# Patient Record
Sex: Male | Born: 1950
Health system: Southern US, Community
[De-identification: ages and names within clinical notes are randomized; demographics above are authoritative.]

## PROBLEM LIST (undated history)

## (undated) DIAGNOSIS — Z8739 Personal history of other diseases of the musculoskeletal system and connective tissue: Secondary | ICD-10-CM

## (undated) DIAGNOSIS — F319 Bipolar disorder, unspecified: Secondary | ICD-10-CM

## (undated) DIAGNOSIS — K219 Gastro-esophageal reflux disease without esophagitis: Secondary | ICD-10-CM

## (undated) DIAGNOSIS — J189 Pneumonia, unspecified organism: Secondary | ICD-10-CM

## (undated) HISTORY — PX: OTHER SURGICAL HISTORY: SHX169

## (undated) HISTORY — PX: HERNIA REPAIR: SHX51

## (undated) HISTORY — PX: KNEE ARTHROSCOPY: SUR90

---

## 2003-02-24 ENCOUNTER — Ambulatory Visit (HOSPITAL_COMMUNITY): Admission: RE | Admit: 2003-02-24 | Discharge: 2003-02-24 | Payer: Self-pay | Admitting: Gastroenterology

## 2004-05-14 ENCOUNTER — Encounter: Admission: RE | Admit: 2004-05-14 | Discharge: 2004-05-14 | Payer: Self-pay | Admitting: Family Medicine

## 2008-04-22 ENCOUNTER — Encounter: Admission: RE | Admit: 2008-04-22 | Discharge: 2008-04-22 | Payer: Self-pay | Admitting: Family Medicine

## 2009-05-21 ENCOUNTER — Ambulatory Visit: Payer: Self-pay | Admitting: Family Medicine

## 2009-05-21 DIAGNOSIS — E785 Hyperlipidemia, unspecified: Secondary | ICD-10-CM

## 2009-05-21 DIAGNOSIS — J40 Bronchitis, not specified as acute or chronic: Secondary | ICD-10-CM | POA: Insufficient documentation

## 2009-05-21 HISTORY — DX: Bronchitis, not specified as acute or chronic: J40

## 2009-05-21 HISTORY — DX: Hyperlipidemia, unspecified: E78.5

## 2009-11-26 ENCOUNTER — Ambulatory Visit (HOSPITAL_COMMUNITY): Admission: RE | Admit: 2009-11-26 | Discharge: 2009-11-26 | Payer: Self-pay | Admitting: Gastroenterology

## 2010-04-22 NOTE — Assessment & Plan Note (Signed)
Summary: cough-thick, clear, hoarseness, x 8-9 dys rm 3   Vital Signs:  Patient Profile:   60 Years Old Male CC:      Cold & URI symptoms Height:     71 inches Weight:      223 pounds O2 Sat:      98 % O2 treatment:    Room Air Temp:     96.9 degrees F oral Pulse rate:   56 / minute Pulse rhythm:   regular Resp:     16 per minute BP sitting:   115 / 82  (right arm) Cuff size:   regular  Vitals Entered By: Areta Haber CMA (May 21, 2009 5:34 PM)                  Current Allergies: No known allergies History of Present Illness Chief Complaint: Cold & URI symptoms History of Present Illness: ONSET 8 DAYS AGO WITH COLD SYMPTOMS. NO FEVER. COUGH STARTED 3 DAYS AGO AND IS OCC PRODUCTIVE. NO SORE THROAT , SOME RUNNY NOSE. NO SEFL TX. HX OF PNEUMONIA IN THE PAST.   Current Problems: BRONCHITIS (ICD-490) HYPERLIPIDEMIA (ICD-272.4)   Current Meds LIPITOR 10 MG TABS (ATORVASTATIN CALCIUM) 1 tab by mouth once daily NEXIUM 40 MG CPDR (ESOMEPRAZOLE MAGNESIUM) 1 tab by mouth once daily ASPIR-LOW 81 MG TBEC (ASPIRIN) 1 tab by mouth once daily LEVAQUIN 500 MG TABS (LEVOFLOXACIN) 1 by mouth Q DAY CHERATUSSIN AC 100-10 MG/5ML SYRP (GUAIFENESIN-CODEINE) 1-2 TSP by mouth Q 6 HRS as needed COUGH  REVIEW OF SYSTEMS Constitutional Symptoms      Denies fever, chills, night sweats, weight loss, weight gain, and fatigue.  Eyes       Denies change in vision, eye pain, eye discharge, glasses, contact lenses, and eye surgery. Ear/Nose/Throat/Mouth       Complains of frequent runny nose and hoarseness.      Denies hearing loss/aids, change in hearing, ear pain, ear discharge, dizziness, frequent nose bleeds, sinus problems, sore throat, and tooth pain or bleeding.  Respiratory       Complains of productive cough, wheezing, and shortness of breath.      Denies asthma, bronchitis, and emphysema/COPD.  Cardiovascular       Denies murmurs, chest pain, and tires easily with exhertion.     Gastrointestinal       Denies stomach pain, nausea/vomiting, diarrhea, constipation, blood in bowel movements, and indigestion. Genitourniary       Denies painful urination, kidney stones, and loss of urinary control. Neurological       Denies paralysis, seizures, and fainting/blackouts. Musculoskeletal       Denies muscle pain, joint pain, joint stiffness, decreased range of motion, redness, swelling, muscle weakness, and gout.  Skin       Denies bruising, unusual mles/lumps or sores, and hair/skin or nail changes.  Psych       Denies mood changes, temper/anger issues, anxiety/stress, speech problems, depression, and sleep problems. Other Comments: x 8-9 dys. Pt has not seen PCP for this.   Past History:  Social History: Last updated: 05/21/2009 Married Never Smoked Alcohol use-yes - 10 weekly Drug use-no Regular exercise-no  Risk Factors: Exercise: no (05/21/2009)  Risk Factors: Smoking Status: never (05/21/2009)  Past Medical History: Hyperlipidemia Acid Reflux   Past Surgical History: Hernia Knee scope  Social History: Married Never Smoked Alcohol use-yes - 10 weekly Drug use-no Regular exercise-no Smoking Status:  never Drug Use:  no Does Patient Exercise:  no Physical Exam  General appearance: well developed, well nourished, no acute distress Nasal: CONGESTED Oral/Pharynx: tongue normal, posterior pharynx without erythema or exudate Chest/Lungs: no rales, wheezes, or rhonchi bilateral, breath sounds equal without effoT. CONGESTED COUGH Heart: regular rate and  rhythm, no murmur Skin: no obvious rashes or lesions Assessment New Problems: BRONCHITIS (ICD-490) HYPERLIPIDEMIA (ICD-272.4)   Plan New Medications/Changes: CHERATUSSIN AC 100-10 MG/5ML SYRP (GUAIFENESIN-CODEINE) 1-2 TSP by mouth Q 6 HRS as needed COUGH  #6 OZ x 0, 05/21/2009, Kimberly Lykins DO LEVAQUIN 500 MG TABS (LEVOFLOXACIN) 1 by mouth Q DAY  ##7 x 0, 05/21/2009, Marvis Moeller  DO  New Orders: New Patient Level III [99203]   Prescriptions: CHERATUSSIN AC 100-10 MG/5ML SYRP (GUAIFENESIN-CODEINE) 1-2 TSP by mouth Q 6 HRS as needed COUGH  #6 OZ x 0   Entered and Authorized by:   Marvis Moeller DO   Signed by:   Marvis Moeller DO on 05/21/2009   Method used:   Print then Give to Patient   RxID:   [id number] LEVAQUIN 500 MG TABS (LEVOFLOXACIN) 1 by mouth Q DAY  ##7 x 0   Entered and Authorized by:   Marvis Moeller DO   Signed by:   Marvis Moeller DO on 05/21/2009   Method used:   Print then Give to Patient   RxID:   [id number]   Patient Instructions: 1)  TYLENOL OR MOTRIN AS NEEDED. MUCINEX RECOMMENDED. AVOID CAFFEINE AND MILK PRODUTS. FOLLOW UP WITH YOUR PCP IF SYMPTOMS PERSIST OR WORSEN.

## 2010-08-06 NOTE — Op Note (Signed)
NAME:  Cameron Stout, Cameron Stout                          ACCOUNT NO.:  [id number]   MEDICAL RECORD NO.:  [id number]                   PATIENT TYPE:  AMB   LOCATION:  ENDO                                 FACILITY:  [facility]   PHYSICIAN:  Danise Edge, M.D.                DATE OF BIRTH:  December 18, 1950   DATE OF PROCEDURE:  02/24/2003  DATE OF DISCHARGE:                                 OPERATIVE REPORT   PROCEDURE:  Diagnostic colonoscopy.   PROCEDURE INDICATION:  Mr. [name] is a 60 year old male born [birth year].  Mr. [name] had five days of painless hematochezia before  spontaneously resolving.  He is scheduled to undergo diagnostic colonoscopy.   ENDOSCOPIST:  Danise Edge, M.D.   PREMEDICATION:  Versed 7.5 mg, Demerol 100 mg.   DESCRIPTION OF PROCEDURE:  After obtaining informed consent, Mr. [name] was  placed in the left lateral decubitus position.  I administered intravenous  Demerol and intravenous Versed to achieve conscious sedation for the  procedure.  The patient's blood pressure, oxygen saturation, and cardiac  rhythm were monitored throughout the procedure and documented in the medical  record.   Anal inspection was normal.  Digital rectal exam revealed a small, non-  nodular prostate.  The Olympus adjustable pediatric colonoscope was  introduced into the rectum and easily advanced to the cecum.  Colonic  preparation for the exam today was excellent.   Rectum normal.   Sigmoid colon and descending colon:  Scattered left colonic diverticulosis  without diverticulitis, diverticular stricture formation, or diverticular  bleeding.   Splenic flexure normal.   Transverse colon normal.   Hepatic flexure normal.   Ascending colon normal.   Cecum and ileocecal valve normal.   ASSESSMENT:  Left colonic diverticulosis; otherwise normal proctocolonoscopy  to the cecum.  No endoscopic evidence for the presence of colorectal  neoplasia, lower gastrointestinal bleeding, or  inflammatory bowel disease.                                               Danise Edge, M.D.    MJ/MEDQ  D:  02/24/2003  T:  02/24/2003  Job:  [id number]   cc:   Donia Guiles, M.D.  [address] [place]  [place] [address]  Fax: [phone]

## 2011-06-01 DIAGNOSIS — M7540 Impingement syndrome of unspecified shoulder: Secondary | ICD-10-CM | POA: Insufficient documentation

## 2011-06-01 HISTORY — DX: Impingement syndrome of unspecified shoulder: M75.40

## 2012-07-19 DIAGNOSIS — M51379 Other intervertebral disc degeneration, lumbosacral region without mention of lumbar back pain or lower extremity pain: Secondary | ICD-10-CM | POA: Insufficient documentation

## 2012-07-19 HISTORY — DX: Other intervertebral disc degeneration, lumbosacral region without mention of lumbar back pain or lower extremity pain: M51.379

## 2012-08-25 DIAGNOSIS — J4520 Mild intermittent asthma, uncomplicated: Secondary | ICD-10-CM | POA: Insufficient documentation

## 2012-08-25 HISTORY — DX: Mild intermittent asthma, uncomplicated: J45.20

## 2014-03-18 DIAGNOSIS — M353 Polymyalgia rheumatica: Secondary | ICD-10-CM | POA: Insufficient documentation

## 2014-03-18 HISTORY — DX: Polymyalgia rheumatica: M35.3

## 2014-04-09 ENCOUNTER — Other Ambulatory Visit: Payer: Self-pay | Admitting: Gastroenterology

## 2014-06-12 ENCOUNTER — Encounter (HOSPITAL_COMMUNITY): Payer: Self-pay | Admitting: *Deleted

## 2014-06-22 NOTE — Anesthesia Preprocedure Evaluation (Signed)
Anesthesia Evaluation  Patient identified by MRN, date of birth, ID band Patient awake    Reviewed: Allergy & Precautions, NPO status , Patient's Chart, lab work & pertinent test results  History of Anesthesia Complications Negative for: history of anesthetic complications  Airway Mallampati: II  TM Distance: >3 FB Neck ROM: Full    Dental no notable dental hx. (+) Dental Advisory Given   Pulmonary neg pulmonary ROS,  breath sounds clear to auscultation  Pulmonary exam normal       Cardiovascular negative cardio ROS  Rhythm:Regular Rate:Normal     Neuro/Psych PSYCHIATRIC DISORDERS Bipolar Disorder negative neurological ROS     GI/Hepatic Neg liver ROS, GERD-  Medicated and Controlled,  Endo/Other  obesity  Renal/GU negative Renal ROS  negative genitourinary   Musculoskeletal negative musculoskeletal ROS (+)   Abdominal   Peds negative pediatric ROS (+)  Hematology negative hematology ROS (+)   Anesthesia Other Findings   Reproductive/Obstetrics negative OB ROS                             Anesthesia Physical Anesthesia Plan  ASA: II  Anesthesia Plan: MAC   Post-op Pain Management:    Induction: Intravenous  Airway Management Planned: Nasal Cannula  Additional Equipment:   Intra-op Plan:   Post-operative Plan:   Informed Consent: I have reviewed the patients History and Physical, chart, labs and discussed the procedure including the risks, benefits and alternatives for the proposed anesthesia with the patient or authorized representative who has indicated his/her understanding and acceptance.   Dental advisory given  Plan Discussed with: CRNA  Anesthesia Plan Comments:         Anesthesia Quick Evaluation

## 2014-06-23 ENCOUNTER — Encounter (HOSPITAL_COMMUNITY)
Admission: RE | Disposition: A | Discharge status: Home / Self Care | Payer: Self-pay | Source: Ambulatory Visit | Attending: Gastroenterology

## 2014-06-23 ENCOUNTER — Encounter (HOSPITAL_COMMUNITY): Payer: Self-pay | Admitting: *Deleted

## 2014-06-23 ENCOUNTER — Ambulatory Visit (HOSPITAL_COMMUNITY): Payer: BLUE CROSS/BLUE SHIELD | Admitting: Certified Registered Nurse Anesthetist

## 2014-06-23 ENCOUNTER — Ambulatory Visit (HOSPITAL_COMMUNITY)
Admission: RE | Admit: 2014-06-23 | Discharge: 2014-06-23 | Disposition: A | Discharge status: Home / Self Care | Payer: BLUE CROSS/BLUE SHIELD | Source: Ambulatory Visit | Attending: Gastroenterology | Admitting: Gastroenterology

## 2014-06-23 DIAGNOSIS — K219 Gastro-esophageal reflux disease without esophagitis: Secondary | ICD-10-CM | POA: Diagnosis not present

## 2014-06-23 DIAGNOSIS — K5190 Ulcerative colitis, unspecified, without complications: Secondary | ICD-10-CM | POA: Diagnosis not present

## 2014-06-23 DIAGNOSIS — Z1211 Encounter for screening for malignant neoplasm of colon: Secondary | ICD-10-CM | POA: Insufficient documentation

## 2014-06-23 DIAGNOSIS — M353 Polymyalgia rheumatica: Secondary | ICD-10-CM | POA: Diagnosis not present

## 2014-06-23 DIAGNOSIS — E780 Pure hypercholesterolemia: Secondary | ICD-10-CM | POA: Diagnosis not present

## 2014-06-23 DIAGNOSIS — K5730 Diverticulosis of large intestine without perforation or abscess without bleeding: Secondary | ICD-10-CM | POA: Diagnosis not present

## 2014-06-23 DIAGNOSIS — F319 Bipolar disorder, unspecified: Secondary | ICD-10-CM | POA: Insufficient documentation

## 2014-06-23 HISTORY — DX: Pneumonia, unspecified organism: J18.9

## 2014-06-23 HISTORY — DX: Gastro-esophageal reflux disease without esophagitis: K21.9

## 2014-06-23 HISTORY — DX: Personal history of other diseases of the musculoskeletal system and connective tissue: Z87.39

## 2014-06-23 HISTORY — DX: Bipolar disorder, unspecified: F31.9

## 2014-06-23 HISTORY — PX: COLONOSCOPY WITH PROPOFOL: SHX5780

## 2014-06-23 SURGERY — COLONOSCOPY WITH PROPOFOL
Anesthesia: Monitor Anesthesia Care

## 2014-06-23 MED ORDER — LIDOCAINE HCL (CARDIAC) 20 MG/ML IV SOLN
INTRAVENOUS | Status: DC | PRN
  Administered 2014-06-23: 80 mg via INTRAVENOUS

## 2014-06-23 MED ORDER — EPHEDRINE SULFATE 50 MG/ML IJ SOLN
INTRAMUSCULAR | Status: DC | PRN
  Administered 2014-06-23: 5 mg via INTRAVENOUS

## 2014-06-23 MED ORDER — PROPOFOL 10 MG/ML IV BOLUS
INTRAVENOUS | Status: AC
  Filled 2014-06-23: qty 40

## 2014-06-23 MED ORDER — PROPOFOL 10 MG/ML IV BOLUS
INTRAVENOUS | Status: DC | PRN
  Administered 2014-06-23 (×2): 20 mg via INTRAVENOUS

## 2014-06-23 MED ORDER — SODIUM CHLORIDE 0.9 % IV SOLN: INTRAVENOUS | Status: DC

## 2014-06-23 MED ORDER — ONDANSETRON HCL 4 MG/2ML IJ SOLN
INTRAMUSCULAR | Status: DC | PRN
  Administered 2014-06-23: 4 mg via INTRAVENOUS

## 2014-06-23 MED ORDER — LACTATED RINGERS IV SOLN
INTRAVENOUS | Status: DC
  Administered 2014-06-23: 1000 mL via INTRAVENOUS

## 2014-06-23 MED ORDER — ONDANSETRON HCL 4 MG/2ML IJ SOLN
INTRAMUSCULAR | Status: AC
  Filled 2014-06-23: qty 2

## 2014-06-23 MED ORDER — LIDOCAINE HCL (CARDIAC) 20 MG/ML IV SOLN
INTRAVENOUS | Status: AC
  Filled 2014-06-23: qty 5

## 2014-06-23 MED ORDER — PROPOFOL INFUSION 10 MG/ML OPTIME
INTRAVENOUS | Status: DC | PRN
  Administered 2014-06-23: 250 ug/kg/min via INTRAVENOUS

## 2014-06-23 SURGICAL SUPPLY — 22 items

## 2014-06-23 NOTE — Transfer of Care (Signed)
Immediate Anesthesia Transfer of Care Note  Patient: Cameron Stout  Procedure(s) Performed: Procedure(s) (LRB): COLONOSCOPY WITH PROPOFOL (N/A)  Patient Location: PACU  Anesthesia Type: MAC  Level of Consciousness: sedated, patient cooperative and responds to stimulation  Airway & Oxygen Therapy: Patient Spontanous Breathing and Patient connected to face mask oxgen  Post-op Assessment: Report given to PACU RN and Post -op Vital signs reviewed and stable  Post vital signs: Reviewed and stable  Complications: No apparent anesthesia complications

## 2014-06-23 NOTE — Discharge Instructions (Signed)

## 2014-06-23 NOTE — H&P (Signed)
  Procedure: Screening colonoscopy. 11/26/2009 normal esophagogastroduodenoscopy. 02/24/2003 normal screening colonoscopy  History: The patient is a 64 year old male born 06/21/1950. He is scheduled to undergo a repeat screening colonoscopy today. This 62 year old son was recently diagnosed with ulcerative colitis.  Medication allergies: Lipitor causes muscle aches  Past medical history: Septoplasty. Inguinal hernia repair. Hypercholesterolemia. Gastroesophageal reflux with normal esophagogastroduodenoscopy in September 2011. Bipolar disorder. Polymyalgia rheumatica  Exam: The patient is alert and lying comfortably on the endoscopy stretcher. Abdomen is soft and nontender to palpation. Lungs are clear to auscultation. Cardiac exam reveals a regular rhythm.  Plan: Proceed with screening colonoscopy

## 2014-06-23 NOTE — Anesthesia Postprocedure Evaluation (Signed)
  Anesthesia Post-op Note  Patient: Cameron Stout  Procedure(s) Performed: Procedure(s) (LRB): COLONOSCOPY WITH PROPOFOL (N/A)  Patient Location: PACU  Anesthesia Type: MAC  Level of Consciousness: awake and alert   Airway and Oxygen Therapy: Patient Spontanous Breathing  Post-op Pain: mild  Post-op Assessment: Post-op Vital signs reviewed, Patient's Cardiovascular Status Stable, Respiratory Function Stable, Patent Airway and No signs of Nausea or vomiting  Last Vitals:  Filed Vitals:   06/23/14 1124  BP: 122/68  Pulse: 63  Temp:   Resp: 14    Post-op Vital Signs: stable   Complications: No apparent anesthesia complications

## 2014-06-23 NOTE — Op Note (Signed)
Procedure: Screening colonoscopy. 11/26/2009 normal esophagogastroduodenoscopy. 02/24/2003 normal screening colonoscopy. 756 year old son recently diagnosed with ulcerative colitis  Endoscopist: Danise EdgeMartin Johnson  Premedication: Propofol administered by anesthesia  Procedure: The patient was placed in the left lateral decubitus position. Anal inspection and digital rectal exam were normal. The Pentax pediatric colonoscope was introduced into the rectum and advanced to the cecum. A normal-appearing appendiceal orifice was identified. A normal-appearing ileocecal valve was intubated and the terminal ileum inspected. Colonic preparation for the exam today was good. Withdrawal time was 9 minutes  Rectum. Normal. Retroflexed view of the distal rectum normal  Sigmoid colon and descending colon. Left colonic diverticulosis  Splenic flexure. Normal  Transverse colon. Normal  Hepatic flexure. Normal  Ascending colon. Normal  Cecum and ileocecal valve. Normal  Terminal ileum. Normal  Assessment: Normal screening colonoscopy.  Recommendation: Schedule repeat screening colonoscopy in 10 years

## 2014-06-24 ENCOUNTER — Encounter (HOSPITAL_COMMUNITY): Payer: Self-pay | Admitting: Gastroenterology

## 2015-05-21 DIAGNOSIS — M8580 Other specified disorders of bone density and structure, unspecified site: Secondary | ICD-10-CM | POA: Insufficient documentation

## 2015-05-21 HISTORY — DX: Other specified disorders of bone density and structure, unspecified site: M85.80

## 2015-12-23 DIAGNOSIS — Z7952 Long term (current) use of systemic steroids: Secondary | ICD-10-CM | POA: Insufficient documentation

## 2015-12-23 HISTORY — DX: Long term (current) use of systemic steroids: Z79.52

## 2016-03-12 DIAGNOSIS — G894 Chronic pain syndrome: Secondary | ICD-10-CM

## 2016-03-12 DIAGNOSIS — E782 Mixed hyperlipidemia: Secondary | ICD-10-CM

## 2016-03-12 DIAGNOSIS — F19982 Other psychoactive substance use, unspecified with psychoactive substance-induced sleep disorder: Secondary | ICD-10-CM | POA: Insufficient documentation

## 2016-03-12 DIAGNOSIS — J302 Other seasonal allergic rhinitis: Secondary | ICD-10-CM

## 2016-03-12 HISTORY — DX: Other psychoactive substance use, unspecified with psychoactive substance-induced sleep disorder: F19.982

## 2016-03-12 HISTORY — DX: Chronic pain syndrome: G89.4

## 2016-03-12 HISTORY — DX: Other seasonal allergic rhinitis: J30.2

## 2016-03-12 HISTORY — DX: Mixed hyperlipidemia: E78.2

## 2016-05-18 DIAGNOSIS — M5412 Radiculopathy, cervical region: Secondary | ICD-10-CM | POA: Insufficient documentation

## 2016-05-18 HISTORY — DX: Radiculopathy, cervical region: M54.12

## 2017-11-27 ENCOUNTER — Other Ambulatory Visit: Payer: Self-pay | Admitting: Family Medicine

## 2017-11-27 ENCOUNTER — Ambulatory Visit
Admission: RE | Admit: 2017-11-27 | Discharge: 2017-11-27 | Disposition: A | Discharge status: Home / Self Care | Payer: Self-pay | Source: Ambulatory Visit | Attending: Family Medicine | Admitting: Family Medicine

## 2017-11-27 DIAGNOSIS — R0689 Other abnormalities of breathing: Secondary | ICD-10-CM

## 2017-11-27 DIAGNOSIS — R0989 Other specified symptoms and signs involving the circulatory and respiratory systems: Secondary | ICD-10-CM

## 2017-12-21 ENCOUNTER — Other Ambulatory Visit: Payer: Self-pay

## 2017-12-21 MED ORDER — OXCARBAZEPINE 150 MG PO TABS: 150.0000 mg | ORAL_TABLET | Freq: Two times a day (BID) | ORAL | 0 refills | Status: DC

## 2017-12-21 NOTE — Progress Notes (Signed)
Sent a one month refill of oxcarbazepine 150mg 

## 2018-01-15 ENCOUNTER — Other Ambulatory Visit: Payer: Self-pay

## 2018-01-15 ENCOUNTER — Other Ambulatory Visit: Payer: Self-pay | Admitting: Psychiatry

## 2018-02-02 DIAGNOSIS — M5416 Radiculopathy, lumbar region: Secondary | ICD-10-CM | POA: Insufficient documentation

## 2018-02-02 DIAGNOSIS — M47816 Spondylosis without myelopathy or radiculopathy, lumbar region: Secondary | ICD-10-CM | POA: Insufficient documentation

## 2018-02-02 HISTORY — DX: Radiculopathy, lumbar region: M54.16

## 2018-02-02 HISTORY — DX: Spondylosis without myelopathy or radiculopathy, lumbar region: M47.816

## 2018-02-19 ENCOUNTER — Other Ambulatory Visit: Payer: Self-pay | Admitting: Psychiatry

## 2018-02-19 NOTE — Telephone Encounter (Signed)
Need to review paper chart  

## 2018-11-23 ENCOUNTER — Ambulatory Visit: Payer: BC Managed Care – PPO | Admitting: Psychiatry

## 2018-11-23 ENCOUNTER — Other Ambulatory Visit: Payer: Self-pay

## 2018-11-23 ENCOUNTER — Encounter: Payer: Self-pay | Admitting: Psychiatry

## 2018-11-23 DIAGNOSIS — F5105 Insomnia due to other mental disorder: Secondary | ICD-10-CM | POA: Diagnosis not present

## 2018-11-23 DIAGNOSIS — F39 Unspecified mood [affective] disorder: Secondary | ICD-10-CM

## 2018-11-23 NOTE — Progress Notes (Signed)
Cameron Stout [id number] 02/25/1951 68 y.o.  Subjective:   Patient ID:  Cameron Stout is a 68 y.o. (DOB 11/24/1950) male.  Chief Complaint:  Chief Complaint  Patient presents with  . Follow-up    Medication Management    HPI Cameron Stout presents to the office today for follow-up of episodic mood disorder.  Last seen November 17, 2017.  No meds were changed.  He remained on Trileptal 150 mg tablet one half every morning and 2 tablets every afternoon, Lunesta 2 mg nightly as needed insomnia  Disc retirement and need for something to do when he cuts back work.   He plans to gradually cut back and not stop.   Overall OK.  During high stress March-May increased Trileptal 2 tab BID and then able to cut it back.   Able to do 5-6 mile hikes.   Motorcycle run again this year for 3100 miles.   Patient reports stable mood and denies depressed but mild manageable irritable moods.  Patient denies any recent difficulty with anxiety.  Patient denies difficulty with sleep initiation or maintenance. Rare Lunesta.  Denies appetite disturbance.  Patient reports that energy and motivation have been good.  Patient denies any difficulty with concentration.  Patient denies any suicidal ideation.  Past Psychiatric Medication Trials: Lithium 750, Depakote, Equetro, Trileptal since 2015, Lunesta, propranolol, fluoxetine, sertraline, ProSom Under care of this practice since 2007  Review of Systems:  Review of Systems  Constitutional: Negative for fatigue.  Neurological: Negative for tremors and weakness.    Medications: I have reviewed the patient's current medications.  Current Outpatient Medications  Medication Sig Dispense Refill  . aspirin EC 81 MG tablet Take 81 mg by mouth daily.    Marland Kitchen. dexlansoprazole (DEXILANT) 60 MG capsule Take 60 mg by mouth daily.    Marland Kitchen. ezetimibe (ZETIA) 10 MG tablet Take 10 mg by mouth every morning.    . loratadine (CLARITIN) 10 MG tablet Take 10 mg by mouth daily.    .  OXcarbazepine (TRILEPTAL) 150 MG tablet Take 1 tablet (150 mg total) by mouth 2 (two) times daily. Take 1 tablet in the morning and 3 at bedtime. (Patient taking differently: Take 150 mg by mouth 2 (two) times daily. Take 1 tablet in the morning and 2 at bedtime.) 120 tablet 6   No current facility-administered medications for this visit.     Medication Side Effects: None  Allergies: No Known Allergies  Past Medical History:  Diagnosis Date  . Bipolar disorder (HCC)   . GERD (gastroesophageal reflux disease)   . H/O polymyalgia rheumatica   . Pneumonia     History reviewed. No pertinent family history.  Social History   Socioeconomic History  . Marital status: Married    Spouse name: Not on file  . Number of children: Not on file  . Years of education: Not on file  . Highest education level: Not on file  Occupational History  . Not on file  Social Needs  . Financial resource strain: Not on file  . Food insecurity    Worry: Not on file    Inability: Not on file  . Transportation needs    Medical: Not on file    Non-medical: Not on file  Tobacco Use  . Smoking status: Never Smoker  . Smokeless tobacco: Never Used  Substance and Sexual Activity  . Alcohol use: Yes    Comment: occassionally  . Drug use: No  . Sexual activity: Not on  file  Lifestyle  . Physical activity    Days per week: Not on file    Minutes per session: Not on file  . Stress: Not on file  Relationships  . Social Herbalist on phone: Not on file    Gets together: Not on file    Attends religious service: Not on file    Active member of club or organization: Not on file    Attends meetings of clubs or organizations: Not on file    Relationship status: Not on file  . Intimate partner violence    Fear of current or ex partner: Not on file    Emotionally abused: Not on file    Physically abused: Not on file    Forced sexual activity: Not on file  Other Topics Concern  . Not on file   Social History Narrative  . Not on file    Past Medical History, Surgical history, Social history, and Family history were reviewed and updated as appropriate.   Please see review of systems for further details on the patient's review from today.   Objective:   Physical Exam:  There were no vitals taken for this visit.  Physical Exam Constitutional:      General: He is not in acute distress.    Appearance: He is well-developed.  Musculoskeletal:        General: No deformity.  Neurological:     Mental Status: He is alert and oriented to person, place, and time.     Coordination: Coordination normal.  Psychiatric:        Attention and Perception: Attention and perception normal. He does not perceive auditory or visual hallucinations.        Mood and Affect: Mood is not anxious or depressed. Affect is not labile, blunt, angry or inappropriate.        Speech: Speech normal.        Behavior: Behavior normal.        Thought Content: Thought content normal. Thought content is not paranoid or delusional. Thought content does not include homicidal or suicidal ideation. Thought content does not include homicidal or suicidal plan.        Cognition and Memory: Cognition and memory normal.        Judgment: Judgment normal.     Comments: Insight intact     Lab Review:  No results found for: NA, K, CL, CO2, GLUCOSE, BUN, CREATININE, CALCIUM, PROT, ALBUMIN, AST, ALT, ALKPHOS, BILITOT, GFRNONAA, GFRAA  No results found for: WBC, RBC, HGB, HCT, PLT, MCV, MCH, MCHC, RDW, LYMPHSABS, MONOABS, EOSABS, BASOSABS  No results found for: POCLITH, LITHIUM   No results found for: PHENYTOIN, PHENOBARB, VALPROATE, CBMZ   .res Assessment: Plan:    Demetrius was seen today for follow-up.  Diagnoses and all orders for this visit:  Episodic mood disorder ([place])  Insomnia due to mental condition   Dx questions chronically between episodic mood disorder, bipolar type II, or atypical depression.  As  noted he is tried several different medications and has responded best to Trileptal.  His primary symptom has been irritability and a history of anger outbursts that affected him at both work and home.  These have generally been managed well with the Trileptal.  He has been on Trileptal since 2015.  He is tolerating it well.  He had a bout with probably myalgia rheumatica which lasted for 3 years.  He is resolved he has been able to get off prednisone.  An autoimmune diet by Dr. Phillis Haggis has been very helpful.  He is exercising and that helps both his mood and his physical conditioning.  Generally sleeping well without insists Lunesta but as needed on occasion.  No med changes indicated  Because of stability follow-up 1 year  Meredith Staggers, MD, DFAPA  Please see After Visit Summary for patient specific instructions.  No future appointments.  No orders of the defined types were placed in this encounter.   -------------------------------

## 2019-03-21 ENCOUNTER — Other Ambulatory Visit: Payer: Self-pay | Admitting: Psychiatry

## 2019-03-21 NOTE — Telephone Encounter (Signed)
Thinks this is an old rx for him

## 2019-03-24 NOTE — Telephone Encounter (Signed)
I'm not sure the correct directions. Looks like discrepancy ?

## 2019-04-13 ENCOUNTER — Ambulatory Visit: Payer: Self-pay

## 2019-05-24 ENCOUNTER — Encounter: Payer: Self-pay | Admitting: Psychiatry

## 2019-05-24 ENCOUNTER — Ambulatory Visit (INDEPENDENT_AMBULATORY_CARE_PROVIDER_SITE_OTHER): Payer: BC Managed Care – PPO | Admitting: Psychiatry

## 2019-05-24 ENCOUNTER — Other Ambulatory Visit: Payer: Self-pay

## 2019-05-24 DIAGNOSIS — F39 Unspecified mood [affective] disorder: Secondary | ICD-10-CM

## 2019-05-24 DIAGNOSIS — F5105 Insomnia due to other mental disorder: Secondary | ICD-10-CM | POA: Diagnosis not present

## 2019-05-24 NOTE — Progress Notes (Signed)
[name] [id number] 1950-09-17 69 y.o.  Subjective:   Patient ID:  Cameron Stout is a 69 y.o. (DOB 10-17-1950) male.  Chief Complaint:  Chief Complaint  Patient presents with  . Follow-up    Medication Management  . Other    Episodic mood disorder  . Sleeping Problem  . Stress    HPI Cameron Stout presents to the office today for follow-up of episodic mood disorder.  Last seen Sept 2020.  No meds were changed.  He remained on Trileptal 150 mg tablet one half every morning and 2 tablets every afternoon, Lunesta 2 mg nightly as needed insomnia  Forcing to partner or join big group and it's stresful and trying to decide what to do.  Affects his sleep.  Parnter not handling it well.  Lunesta helps.    Disc retirement and need for something to do when he cuts back work.   He plans to gradually cut back and not stop.   Overall OK.  During high stress March-May increased Trileptal 2 tab BID and then able to cut it back.   Able to do 5-6 mile hikes.   Motorcycle run again this year for 3100 miles.   Patient reports stable mood and denies depressed but mild manageable irritable moods.  Patient denies any recent difficulty with anxiety.  Patient denies difficulty with sleep initiation or maintenance. Rare Lunesta. Average 6 hours sleep is good.   Denies appetite disturbance.  Patient reports that energy and motivation have been good.  Patient denies any difficulty with concentration.  Patient denies any suicidal ideation.  Past Psychiatric Medication Trials: Lithium 750, Depakote, Equetro, Trileptal since 2015, Lunesta, propranolol, fluoxetine, sertraline, ProSom Under care of this practice since 2007  Review of Systems:  Review of Systems  Constitutional: Negative for fatigue.  Neurological: Negative for tremors and weakness.    Medications: I have reviewed the patient's current medications.  Current Outpatient Medications  Medication Sig Dispense Refill  .  aspirin EC 81 MG tablet Take 81 mg by mouth daily.    Marland Kitchen dexlansoprazole (DEXILANT) 60 MG capsule Take 60 mg by mouth daily.    . Eszopiclone 3 MG TABS TAKE 1 TABLET BY MOUTH AT BEDTIME 30 tablet 4  . ezetimibe (ZETIA) 10 MG tablet Take 10 mg by mouth every morning.    . loratadine (CLARITIN) 10 MG tablet Take 10 mg by mouth daily.    . OXcarbazepine (TRILEPTAL) 150 MG tablet TAKE 1 TABLET BY MOUTH TWICE A DAY 1 TAB IN AM & 3TABS AT BEDTIME 120 tablet 6   No current facility-administered medications for this visit.    Medication Side Effects: None  Allergies: No Known Allergies  Past Medical History:  Diagnosis Date  . Bipolar disorder ([place])   . GERD (gastroesophageal reflux disease)   . H/O polymyalgia rheumatica   . Pneumonia     History reviewed. No pertinent family history.  Social History   Socioeconomic History  . Marital status: Married    Spouse name: Not on file  . Number of children: Not on file  . Years of education: Not on file  . Highest education level: Not on file  Occupational History  . Not on file  Tobacco Use  . Smoking status: Never Smoker  . Smokeless tobacco: Never Used  Substance and Sexual Activity  . Alcohol use: Yes    Comment: occassionally  . Drug use: No  . Sexual activity: Not on file  Other Topics Concern  .  Not on file  Social History Narrative  . Not on file   Social Determinants of Health   Financial Resource Strain:   . Difficulty of Paying Living Expenses: Not on file  Food Insecurity:   . Worried About Programme researcher, broadcasting/film/video in the Last Year: Not on file  . Ran Out of Food in the Last Year: Not on file  Transportation Needs:   . Lack of Transportation (Medical): Not on file  . Lack of Transportation (Non-Medical): Not on file  Physical Activity:   . Days of Exercise per Week: Not on file  . Minutes of Exercise per Session: Not on file  Stress:   . Feeling of Stress : Not on file  Social Connections:   . Frequency of  Communication with Friends and Family: Not on file  . Frequency of Social Gatherings with Friends and Family: Not on file  . Attends Religious Services: Not on file  . Active Member of Clubs or Organizations: Not on file  . Attends Banker Meetings: Not on file  . Marital Status: Not on file  Intimate Partner Violence:   . Fear of Current or Ex-Partner: Not on file  . Emotionally Abused: Not on file  . Physically Abused: Not on file  . Sexually Abused: Not on file    Past Medical History, Surgical history, Social history, and Family history were reviewed and updated as appropriate.   Please see review of systems for further details on the patient's review from today.   Objective:   Physical Exam:  There were no vitals taken for this visit.  Physical Exam Constitutional:      General: He is not in acute distress.    Appearance: He is well-developed.  Musculoskeletal:        General: No deformity.  Neurological:     Mental Status: He is alert and oriented to person, place, and time.     Coordination: Coordination normal.  Psychiatric:        Attention and Perception: Attention and perception normal. He does not perceive auditory or visual hallucinations.        Mood and Affect: Mood is not anxious or depressed. Affect is not labile, blunt, angry or inappropriate.        Speech: Speech normal.        Behavior: Behavior normal.        Thought Content: Thought content normal. Thought content is not paranoid or delusional. Thought content does not include homicidal or suicidal ideation. Thought content does not include homicidal or suicidal plan.        Cognition and Memory: Cognition and memory normal.        Judgment: Judgment normal.     Comments: Insight intact     Lab Review:  No results found for: NA, K, CL, CO2, GLUCOSE, BUN, CREATININE, CALCIUM, PROT, ALBUMIN, AST, ALT, ALKPHOS, BILITOT, GFRNONAA, GFRAA  No results found for: WBC, RBC, HGB, HCT, PLT, MCV,  MCH, MCHC, RDW, LYMPHSABS, MONOABS, EOSABS, BASOSABS  No results found for: POCLITH, LITHIUM   No results found for: PHENYTOIN, PHENOBARB, VALPROATE, CBMZ   .res Assessment: Plan:    [name] was seen today for follow-up, other, sleeping problem and stress.  Diagnoses and all orders for this visit:  Episodic mood disorder (HCC)  Insomnia due to mental condition   Dx questions chronically between episodic mood disorder, bipolar type II, or atypical depression.  As noted he is tried several different medications and has  responded best to Trileptal.  His primary symptom has been irritability and a history of anger outbursts that affected him at both work and home.  These have generally been managed well with the Trileptal.  He has been on Trileptal since 2015.  He is tolerating it well.  He had a bout with probably myalgia rheumatica which lasted for 3 years.  He is resolved he has been able to get off prednisone.  An autoimmune diet by Dr. Phillis Haggis has been very helpful.  He is exercising and that helps both his mood and his physical conditioning.  Generally sleeping well without insists Lunesta but as needed on occasion.  Disc dependence and tolerance and how to do it.    Discussed his gradual slow transition into retirement and the need to develop hobbies for mental health reasons.  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  No med changes indicated  Because of stability follow-up 1 year  Meredith Staggers, MD, DFAPA  Please see After Visit Summary for patient specific instructions.  No future appointments.  No orders of the defined types were placed in this encounter.   -------------------------------

## 2019-10-16 ENCOUNTER — Other Ambulatory Visit: Payer: Self-pay | Admitting: Psychiatry

## 2019-10-17 NOTE — Telephone Encounter (Signed)
2 different directions? Bid and 1 in am and 3 in pm?

## 2019-11-29 ENCOUNTER — Ambulatory Visit: Payer: BC Managed Care – PPO | Admitting: Psychiatry

## 2019-12-13 ENCOUNTER — Encounter: Payer: Self-pay | Admitting: Psychiatry

## 2019-12-13 ENCOUNTER — Ambulatory Visit (INDEPENDENT_AMBULATORY_CARE_PROVIDER_SITE_OTHER): Payer: BC Managed Care – PPO | Admitting: Psychiatry

## 2019-12-13 ENCOUNTER — Other Ambulatory Visit: Payer: Self-pay

## 2019-12-13 DIAGNOSIS — F39 Unspecified mood [affective] disorder: Secondary | ICD-10-CM | POA: Diagnosis not present

## 2019-12-13 DIAGNOSIS — F5105 Insomnia due to other mental disorder: Secondary | ICD-10-CM | POA: Diagnosis not present

## 2019-12-13 MED ORDER — ESZOPICLONE 3 MG PO TABS: 3.0000 mg | ORAL_TABLET | Freq: Every day | ORAL | 4 refills | Status: DC

## 2019-12-13 MED ORDER — OXCARBAZEPINE 150 MG PO TABS: ORAL_TABLET | ORAL | 2 refills | Status: DC

## 2019-12-13 NOTE — Progress Notes (Signed)
Haniel Fix [id number] 09-Sep-1950 69 y.o.  Subjective:   Patient ID:  Cameron Stout is a 69 y.o. (DOB 29-Jun-1950) male.  Chief Complaint:  Chief Complaint  Patient presents with  . Follow-up    mood and meds    HPI Edenilson Austad Timothy presents to the office today for follow-up of episodic mood disorder.  seen Sept 2020.  No meds were changed.  He remained on Trileptal 150 mg tablet one half every morning and 2 tablets every afternoon, Lunesta 2 mg nightly as needed insomnia  05/24/19 appt with the following noted: Forcing to partner or join big group and it's stresful and trying to decide what to do.  Affects his sleep.  Parnter not handling it well.  Lunesta helps.   Disc retirement and need for something to do when he cuts back work.   He plans to gradually cut back and not stop. Overall OK.  During high stress March-May increased Trileptal 2 tab BID and then able to cut it back.  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  Big decision.  Plans to only work 3 days/week.  Plans to continue that for 6 mos and reassess. Busy with side businesses rental houses.   Mood has been stable.  Stress can affect sleep and some irritability at times. Still rides motorcycle for fun.  No complaints with meds.  Patient reports stable mood and denies depressed but mild manageable irritable moods.  Patient denies any recent difficulty with anxiety.  Patient denies difficulty with sleep initiation or maintenance. Rare Lunesta. Average 6 hours sleep is good.   Denies appetite disturbance.  Patient reports that energy and motivation have been good.  Patient denies any difficulty with concentration. Patient denies any suicidal ideation.  Past Psychiatric Medication Trials: Lithium 750, Depakote, Equetro, Trileptal since 2015, Lunesta, propranolol, fluoxetine, sertraline, ProSom Under care of this practice since 2007  Review of Systems:   Review of Systems  Constitutional: Negative for fatigue.  Neurological: Negative for dizziness, tremors and weakness.    Medications: I have reviewed the patient's current medications.  Current Outpatient Medications  Medication Sig Dispense Refill  . aspirin EC 81 MG tablet Take 81 mg by mouth daily.    Marland Kitchen dexlansoprazole (DEXILANT) 60 MG capsule Take 60 mg by mouth daily.    . Eszopiclone 3 MG TABS Take 1 tablet (3 mg total) by mouth at bedtime. Take immediately before bedtime 30 tablet 4  . ezetimibe (ZETIA) 10 MG tablet Take 10 mg by mouth every morning.    . loratadine (CLARITIN) 10 MG tablet Take 10 mg by mouth daily.    . OXcarbazepine (TRILEPTAL) 150 MG tablet 1 twice daily and 2 tablets at night 360 tablet 2   No current facility-administered medications for this visit.    Medication Side Effects: None  Allergies: No Known Allergies  Past Medical History:  Diagnosis Date  . Bipolar disorder (HCC)   . GERD (gastroesophageal reflux disease)   . H/O polymyalgia rheumatica   . Pneumonia     History reviewed. No pertinent family history.  Social History   Socioeconomic History  . Marital status: Married    Spouse name: Not on file  . Number of children: Not on file  . Years of education: Not on file  . Highest education level: Not on file  Occupational History  . Not on file  Tobacco Use  . Smoking status: Never  Smoker  . Smokeless tobacco: Never Used  Substance and Sexual Activity  . Alcohol use: Yes    Comment: occassionally  . Drug use: No  . Sexual activity: Not on file  Other Topics Concern  . Not on file  Social History Narrative  . Not on file   Social Determinants of Health   Financial Resource Strain:   . Difficulty of Paying Living Expenses: Not on file  Food Insecurity:   . Worried About Programme researcher, broadcasting/film/video in the Last Year: Not on file  . Ran Out of Food in the Last Year: Not on file  Transportation Needs:   . Lack of Transportation  (Medical): Not on file  . Lack of Transportation (Non-Medical): Not on file  Physical Activity:   . Days of Exercise per Week: Not on file  . Minutes of Exercise per Session: Not on file  Stress:   . Feeling of Stress : Not on file  Social Connections:   . Frequency of Communication with Friends and Family: Not on file  . Frequency of Social Gatherings with Friends and Family: Not on file  . Attends Religious Services: Not on file  . Active Member of Clubs or Organizations: Not on file  . Attends Banker Meetings: Not on file  . Marital Status: Not on file  Intimate Partner Violence:   . Fear of Current or Ex-Partner: Not on file  . Emotionally Abused: Not on file  . Physically Abused: Not on file  . Sexually Abused: Not on file    Past Medical History, Surgical history, Social history, and Family history were reviewed and updated as appropriate.   Please see review of systems for further details on the patient's review from today.   Objective:   Physical Exam:  There were no vitals taken for this visit.  Physical Exam Constitutional:      General: He is not in acute distress.    Appearance: He is well-developed.  Musculoskeletal:        General: No deformity.  Neurological:     Mental Status: He is alert and oriented to person, place, and time.     Coordination: Coordination normal.  Psychiatric:        Attention and Perception: Attention and perception normal. He does not perceive auditory or visual hallucinations.        Mood and Affect: Mood is not anxious or depressed. Affect is not labile, blunt, angry or inappropriate.        Speech: Speech normal.        Behavior: Behavior normal. Behavior is not slowed.        Thought Content: Thought content normal. Thought content is not paranoid or delusional. Thought content does not include homicidal or suicidal ideation. Thought content does not include homicidal or suicidal plan.        Cognition and Memory:  Cognition and memory normal.        Judgment: Judgment normal.     Comments: Insight intact     Lab Review:  No results found for: NA, K, CL, CO2, GLUCOSE, BUN, CREATININE, CALCIUM, PROT, ALBUMIN, AST, ALT, ALKPHOS, BILITOT, GFRNONAA, GFRAA  No results found for: WBC, RBC, HGB, HCT, PLT, MCV, MCH, MCHC, RDW, LYMPHSABS, MONOABS, EOSABS, BASOSABS  No results found for: POCLITH, LITHIUM   No results found for: PHENYTOIN, PHENOBARB, VALPROATE, CBMZ   .res Assessment: Plan:    Hridaan was seen today for follow-up.  Diagnoses and all orders for  this visit:  Episodic mood disorder (HCC) -     OXcarbazepine (TRILEPTAL) 150 MG tablet; 1 twice daily and 2 tablets at night  Insomnia due to mental condition -     Eszopiclone 3 MG TABS; Take 1 tablet (3 mg total) by mouth at bedtime. Take immediately before bedtime   Dx questions chronically between episodic mood disorder, bipolar type II, or atypical depression.  As noted he is tried several different medications and has responded best to Trileptal.  His primary symptom has been irritability and a history of anger outbursts that affected him at both work and home.  These have generally been managed well with the Trileptal.  He has been on Trileptal since 2015.  He is tolerating it well.  He had a bout with probably myalgia rheumatica which lasted for 3 years.  He is resolved he has been able to get off prednisone.  An autoimmune diet by Dr. Phillis Haggis has been very helpful.  He is exercising and that helps both his mood and his physical conditioning. Kept off 25 # he lost a couple years ago.  Generally sleeping well without insists Lunesta but as needed on occasion.  Disc dependence and tolerance and how to do it.    Discussed his gradual slow transition into retirement and the need to develop hobbies for mental health reasons.  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 Supportive therapy on dealing  with this.  No med changes indicated  6 mos  Meredith Staggers, MD, DFAPA  Please see After Visit Summary for patient specific instructions.  No future appointments.  No orders of the defined types were placed in this encounter.   -------------------------------

## 2019-12-16 ENCOUNTER — Other Ambulatory Visit: Payer: Self-pay | Admitting: Family Medicine

## 2019-12-16 DIAGNOSIS — Z8249 Family history of ischemic heart disease and other diseases of the circulatory system: Secondary | ICD-10-CM

## 2020-01-29 ENCOUNTER — Other Ambulatory Visit: Payer: Self-pay | Admitting: Family Medicine

## 2020-01-30 ENCOUNTER — Ambulatory Visit
Admission: RE | Admit: 2020-01-30 | Discharge: 2020-01-30 | Disposition: A | Discharge status: Home / Self Care | Payer: BC Managed Care – PPO | Source: Ambulatory Visit | Attending: Family Medicine | Admitting: Family Medicine

## 2020-01-30 DIAGNOSIS — Z8249 Family history of ischemic heart disease and other diseases of the circulatory system: Secondary | ICD-10-CM

## 2020-02-28 DIAGNOSIS — K219 Gastro-esophageal reflux disease without esophagitis: Secondary | ICD-10-CM | POA: Insufficient documentation

## 2020-02-28 DIAGNOSIS — F319 Bipolar disorder, unspecified: Secondary | ICD-10-CM | POA: Insufficient documentation

## 2020-02-28 DIAGNOSIS — J189 Pneumonia, unspecified organism: Secondary | ICD-10-CM | POA: Insufficient documentation

## 2020-02-28 DIAGNOSIS — Z8739 Personal history of other diseases of the musculoskeletal system and connective tissue: Secondary | ICD-10-CM | POA: Insufficient documentation

## 2020-02-28 NOTE — Progress Notes (Signed)
Cardiology Office Note:    Date:  03/02/2020   ID:  Cameron Stout, DOB 08-07-1950, MRN [id number]  PCP:  Cameron Raider, MD  Cardiologist:  Cameron Herrlich, MD   Referring MD: Cameron Raider, MD  ASSESSMENT:    1. Thoracic aortic aneurysm without rupture (HCC)   2. Hyperlipidemia LDL goal <70   3. H/O polymyalgia rheumatica    PLAN:    In order of problems listed above:  1. He will explore his family history for history of aortopathy we will plan to do a noncontrast CT in 6 months to reassess his aorta.  If he require treatment for hypertension beta-blocker would be appropriate 2. After discussion with the patient we will not put him back on a statin and PCSK9 inhibitor and you touch base with his rheumatologist and will recheck labs in 2 months 3. Iasked him to communicate with his rheumatologist prior to starting PCSK9 inhibitor with a high 10-year cardiovascular risk.  Next appointment 3 months   Medication Adjustments/Labs and Tests Ordered: Current medicines are reviewed at length with the patient today.  Concerns regarding medicines are outlined above.  Orders Placed This Encounter  Procedures  . CT Chest Wo Contrast  . Lipid panel  . Lipoprotein A (LPA)  . EKG 12-Lead   Meds ordered this encounter  Medications  . Evolocumab (REPATHA SURECLICK) 140 MG/ML SOAJ    Sig: Inject 140 mg into the skin every 14 (fourteen) days.    Dispense:  2 mL    Refill:  6     Chief Complaint  Patient presents with  . Thoracic Aortic Aneurysm    4.5 cm a sending on coronary calcium score  . Hyperlipidemia    History of Present Illness:    Cameron Stout is a 69 y.o. male who is being seen today for the evaluation of cardiovascular risk at the request of Cameron Raider, MD.  Chart review shows that he had cardiac CT calcium scoring performed 01/30/2020 his calcium score was 72  45th percentile for age and sex with focal calcification left anterior descending and  right coronary artery.  He also had fusiform aneurysmal dilation of the ascending aorta maximum 45 mm.  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 He taken a statin in the past unfortunately developed polymyalgia rheumatica and had a long period where he took steroids up to 5 years before it stabilized. Is been maintained on Zetia if again residual elevation LDL of 145 and non-HDL cholesterol of 160. We had a discussion on treatment he has has not ever except a statin and I am uncertain if I would prescribe 1 for him in the future and will initiate a PCSK9 inhibitor and he will touch base with his rheumatologist by my chart. Is very vigorous exercise on a regular basis and has no exercise intolerance chest pain shortness of breath palpitation or no known vascular disease clinically.  Past Medical History:  Diagnosis Date  . Bipolar disorder (HCC)   . GERD (gastroesophageal reflux disease)   . H/O polymyalgia rheumatica   . Pneumonia     Past Surgical History:  Procedure Laterality Date  . back injections     in S 1 joint  . COLONOSCOPY WITH PROPOFOL N/A 06/23/2014   Procedure: COLONOSCOPY WITH PROPOFOL;  Surgeon: Cameron Bumpers, MD;  Location: [facility];  Service: Endoscopy;  Laterality: N/A;  . HERNIA REPAIR     inguinal  . KNEE ARTHROSCOPY  Current Medications: Current Meds  Medication Sig  . aspirin EC 81 MG tablet Take 81 mg by mouth daily.  Marland Kitchen dexlansoprazole (DEXILANT) 60 MG capsule Take 60 mg by mouth daily.  Marland Kitchen ezetimibe (ZETIA) 10 MG tablet Take 10 mg by mouth every morning.  . loratadine (CLARITIN) 10 MG tablet Take 10 mg by mouth daily.  . OXcarbazepine (TRILEPTAL) 150 MG tablet 1 twice daily and 2 tablets at night     Allergies:   Patient has no known allergies.   Social History   Socioeconomic History  . Marital status: Married    Spouse name: Not on file  . Number of children: Not on file  . Years of education: Not on file  .  Highest education level: Not on file  Occupational History  . Not on file  Tobacco Use  . Smoking status: Never Smoker  . Smokeless tobacco: Never Used  Substance and Sexual Activity  . Alcohol use: Yes    Comment: occassionally  . Drug use: No  . Sexual activity: Not on file  Other Topics Concern  . Not on file  Social History Narrative  . Not on file   Social Determinants of Health   Financial Resource Strain: Not on file  Food Insecurity: Not on file  Transportation Needs: Not on file  Physical Activity: Not on file  Stress: Not on file  Social Connections: Not on file     Family History: His family history is noteworthy for his father having CABG 2 episodes of bypass surgery and died in his 49s on 6 fortunately he had 2 uncles who died of sudden death in their 79s and 1 with CAD who lived a long life.  He said he will explore with his sister whether there is any family history of aortic aneurysm The patient's family history includes Heart attack in his brother and brother; Heart disease in his father; Hypertension in his mother; Obesity in his mother.  ROS:   ROS Please see the history of present illness.     All other systems reviewed and are negative.  EKGs/Labs/Other Studies Reviewed:    The following studies were reviewed today:   EKG:  EKG is  ordered today.  The ekg ordered today is personally reviewed and demonstrates sinus bradycardia 53 bpm first-degree AV block otherwise normal  Recent Labs: Labs from his PCP: Cholesterol 240 triglyceride 89 HDL 80 non-HDL cholesterol 161 very elevated and his LDL cholesterol 145 on Zetia.  CMP normal glucose 86 creatinine 0.93 and liver function test.  Physical Exam:    VS:  BP (!) 144/91   Pulse (!) 53   Ht 5\' 11"  (1.803 m)   Wt 206 lb (93.4 kg)   SpO2 98%   BMI 28.73 kg/m     Wt Readings from Last 3 Encounters:  03/02/20 206 lb (93.4 kg)  06/23/14 220 lb (99.8 kg)     GEN: No xanthoma xanthelasma well  nourished, well developed in no acute distress HEENT: Normal NECK: No JVD; No carotid bruits LYMPHATICS: No lymphadenopathy CARDIAC: RRR, no murmurs, rubs, gallops RESPIRATORY:  Clear to auscultation without rales, wheezing or rhonchi  ABDOMEN: Soft, non-tender, non-distended MUSCULOSKELETAL:  No edema; No deformity  SKIN: Warm and dry NEUROLOGIC:  Alert and oriented x 3 PSYCHIATRIC:  Normal affect     Signed, 08/23/14, MD  03/02/2020 10:16 AM    [facility]

## 2020-03-02 ENCOUNTER — Encounter: Payer: Self-pay | Admitting: Cardiology

## 2020-03-02 ENCOUNTER — Ambulatory Visit: Payer: BC Managed Care – PPO | Admitting: Cardiology

## 2020-03-02 ENCOUNTER — Other Ambulatory Visit: Payer: Self-pay

## 2020-03-02 VITALS — BP 144/91 | HR 53 | Ht 71.0 in | Wt 206.0 lb

## 2020-03-02 DIAGNOSIS — Z8739 Personal history of other diseases of the musculoskeletal system and connective tissue: Secondary | ICD-10-CM

## 2020-03-02 DIAGNOSIS — E785 Hyperlipidemia, unspecified: Secondary | ICD-10-CM

## 2020-03-02 DIAGNOSIS — I712 Thoracic aortic aneurysm, without rupture, unspecified: Secondary | ICD-10-CM

## 2020-03-02 MED ORDER — REPATHA SURECLICK 140 MG/ML ~~LOC~~ SOAJ: 140.0000 mg | SUBCUTANEOUS | 6 refills | Status: DC

## 2020-03-02 NOTE — Patient Instructions (Signed)
Medication Instructions:  Your physician has recommended you make the following change in your medication:  START: Repatha 140 mg inject one pen into the skin every 14 days.  *If you need a refill on your cardiac medications before your next appointment, please call your pharmacy*   Lab Work: Your physician recommends that you return for lab work in: 8 weeks Lipids, LPA If you have labs (blood work) drawn today and your tests are completely normal, you will receive your results only by: Marland Kitchen MyChart Message (if you have MyChart) OR . A paper copy in the mail If you have any lab test that is abnormal or we need to change your treatment, we will call you to review the results.   Testing/Procedures: We have put in the order for you to have a chest CT completed in 6 months. They will call you to schedule this appointment closer to time.    Follow-Up: At [facility], you and your health needs are our priority.  As part of our continuing mission to provide you with exceptional heart care, we have created designated Provider Care Teams.  These Care Teams include your primary Cardiologist (physician) and Advanced Practice Providers (APPs -  Physician Assistants and Nurse Practitioners) who all work together to provide you with the care you need, when you need it.  We recommend signing up for the patient portal called "MyChart".  Sign up information is provided on this After Visit Summary.  MyChart is used to connect with patients for Virtual Visits (Telemedicine).  Patients are able to view lab/test results, encounter notes, upcoming appointments, etc.  Non-urgent messages can be sent to your provider as well.   To learn more about what you can do with MyChart, go to [email or web address].    Your next appointment:   3 month(s)  The format for your next appointment:   In Person  Provider:   Norman Herrlich, MD   Other Instructions

## 2020-04-27 ENCOUNTER — Telehealth: Payer: Self-pay

## 2020-04-27 NOTE — Telephone Encounter (Signed)
PA started on CMM for Repatha. Key [id number]

## 2020-05-12 NOTE — Telephone Encounter (Signed)
PA approved for Repatha 

## 2020-06-08 ENCOUNTER — Other Ambulatory Visit: Payer: Self-pay

## 2020-06-08 ENCOUNTER — Ambulatory Visit (INDEPENDENT_AMBULATORY_CARE_PROVIDER_SITE_OTHER): Payer: PPO | Admitting: Psychiatry

## 2020-06-08 ENCOUNTER — Encounter: Payer: Self-pay | Admitting: Psychiatry

## 2020-06-08 DIAGNOSIS — F5105 Insomnia due to other mental disorder: Secondary | ICD-10-CM

## 2020-06-08 DIAGNOSIS — F39 Unspecified mood [affective] disorder: Secondary | ICD-10-CM | POA: Diagnosis not present

## 2020-06-08 NOTE — Progress Notes (Signed)
Cameron Stout [id number] 1950-07-05 70 y.o.  Subjective:   Patient ID:  Cameron Stout is a 70 y.o. (DOB 1950-07-14) male.  Chief Complaint:  Chief Complaint  Patient presents with  . Episodic mood disorder (HCC)  . Follow-up    HPI Cameron Stout presents to the office today for follow-up of episodic mood disorder.  seen Sept 2020.  No meds were changed.  He remained on Trileptal 150 mg tablet one half every morning and 2 tablets every afternoon, Lunesta 2 mg nightly as needed insomnia  05/24/19 appt with the following noted: Forcing to partner or join big group and it's stresful and trying to decide what to do.  Affects his sleep.  Parnter not handling it well.  Lunesta helps.   Disc retirement and need for something to do when he cuts back work.   He plans to gradually cut back and not stop. Overall OK.  During high stress March-May increased Trileptal 2 tab BID and then able to cut it back.  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  Big decision.  Plans to only work 3 days/week.  Plans to continue that for 6 mos and reassess. Busy with side businesses rental houses.   Mood has been stable.  Stress can affect sleep and some irritability at times. Still rides motorcycle for fun.  No complaints with meds. 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  Some patients upset over his retirement.  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   Remodeling house.  Staying busy mostly.  Exercising and going to gym.  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   Taking bike trips now with a group.  Has some trips.  Mood is better now that transition is further down the road.  Mood is fine.  Tends to be emotional easily.  Patient reports stable mood and denies depressed but mild manageable irritable moods.  Patient denies any recent difficulty with anxiety.  Patient denies difficulty with sleep  initiation or maintenance. Rare Lunesta. Average 6 hours sleep is good.  Sleep is good generally unless occ pain. Denies appetite disturbance.  Patient reports that energy and motivation have been good.  Patient denies any difficulty with concentration. Patient denies any suicidal ideation.  Past Psychiatric Medication Trials: Lithium 750, Depakote, Equetro, Trileptal since 2015, Lunesta, propranolol, fluoxetine, sertraline, ProSom Under care of this practice since 2007  Review of Systems:  Review of Systems  Constitutional: Negative for fatigue.  Cardiovascular: Negative for palpitations.  Neurological: Negative for dizziness, tremors and weakness.    Medications: I have reviewed the patient's current medications.  Current Outpatient Medications  Medication Sig Dispense Refill  . aspirin EC 81 MG tablet Take 81 mg by mouth daily.    Marland Kitchen dexlansoprazole (DEXILANT) 60 MG capsule Take 60 mg by mouth daily.    . Evolocumab (REPATHA SURECLICK) 140 MG/ML SOAJ Inject 140 mg into the skin every 14 (fourteen) days. 2 mL 6  . ezetimibe (ZETIA) 10 MG tablet Take 10 mg by mouth every morning.    . loratadine (CLARITIN) 10 MG tablet Take 10 mg by mouth daily.    . OXcarbazepine (TRILEPTAL) 150 MG tablet 1 twice daily and 2 tablets at night (Patient taking differently: 1 in am and 2 tablets at night) 360 tablet 2   No current facility-administered medications for this visit.    Medication Side  Effects: None  Allergies:  Allergies  Allergen Reactions  . Atorvastatin Other (See Comments)    He developed polymyalgia rheumatica was on long-term steroids for 5 years and could not take a statin again    Past Medical History:  Diagnosis Date  . Bipolar disorder (HCC)   . GERD (gastroesophageal reflux disease)   . H/O polymyalgia rheumatica   . Pneumonia     Family History  Problem Relation Age of Onset  . Hypertension Mother   . Obesity Mother   . Heart disease Father   . Heart attack  Brother   . Heart attack Brother     Social History   Socioeconomic History  . Marital status: Married    Spouse name: Not on file  . Number of children: Not on file  . Years of education: Not on file  . Highest education level: Not on file  Occupational History  . Not on file  Tobacco Use  . Smoking status: Never Smoker  . Smokeless tobacco: Never Used  Substance and Sexual Activity  . Alcohol use: Yes    Comment: occassionally  . Drug use: No  . Sexual activity: Not on file  Other Topics Concern  . Not on file  Social History Narrative  . Not on file   Social Determinants of Health   Financial Resource Strain: Not on file  Food Insecurity: Not on file  Transportation Needs: Not on file  Physical Activity: Not on file  Stress: Not on file  Social Connections: Not on file  Intimate Partner Violence: Not on file    Past Medical History, Surgical history, Social history, and Family history were reviewed and updated as appropriate.   Please see review of systems for further details on the patient's review from today.   Objective:   Physical Exam:  There were no vitals taken for this visit.  Physical Exam Constitutional:      General: He is not in acute distress.    Appearance: He is well-developed.  Musculoskeletal:        General: No deformity.  Neurological:     Mental Status: He is alert and oriented to person, place, and time.     Coordination: Coordination normal.  Psychiatric:        Attention and Perception: Attention and perception normal. He does not perceive auditory or visual hallucinations.        Mood and Affect: Mood is not anxious or depressed. Affect is not labile, blunt, angry or inappropriate.        Speech: Speech normal. Speech is not slurred.        Behavior: Behavior normal. Behavior is not slowed.        Thought Content: Thought content normal. Thought content is not paranoid or delusional. Thought content does not include homicidal or  suicidal ideation. Thought content does not include homicidal or suicidal plan.        Cognition and Memory: Cognition and memory normal.        Judgment: Judgment normal.     Comments: Insight intact     Lab Review:  No results found for: NA, K, CL, CO2, GLUCOSE, BUN, CREATININE, CALCIUM, PROT, ALBUMIN, AST, ALT, ALKPHOS, BILITOT, GFRNONAA, GFRAA  No results found for: WBC, RBC, HGB, HCT, PLT, MCV, MCH, MCHC, RDW, LYMPHSABS, MONOABS, EOSABS, BASOSABS  No results found for: POCLITH, LITHIUM   No results found for: PHENYTOIN, PHENOBARB, VALPROATE, CBMZ   .res Assessment: Plan:    Dara was  seen today for episodic mood disorder (hcc) and follow-up.  Diagnoses and all orders for this visit:  Episodic mood disorder (HCC)  Insomnia due to mental condition   Dx questions chronically between episodic mood disorder, bipolar type II, or atypical depression.  As noted he is tried several different medications and has responded best to Trileptal.  His primary symptom has been irritability and a history of anger outbursts that affected him at both work and home.  These have generally been managed well with the Trileptal.  He has been on Trileptal since 2015.  He is tolerating it well.  He had a bout with probably myalgia rheumatica which lasted for 3 years.  He is resolved he has been able to get off prednisone.  An autoimmune diet by Dr. Phillis Haggis has been very helpful.  He is exercising and that helps both his mood and his physical conditioning. Kept off 25 # he lost a couple years ago.  Generally sleeping well without insists Lunesta but as needed on occasion.  Disc dependence and tolerance and how to do it.    Discussed his gradual slow transition into retirement and the need to develop hobbies for mental health reasons.  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 Supportive therapy on dealing with this. Dealing with transition into  retirement.  No med changes indicated .  Option increase Trileptal to 300 BID during the stress.  6 mos  Meredith Staggers, MD, DFAPA  Please see After Visit Summary for patient specific instructions.  Future Appointments  Date Time Provider Department Center  06/17/2020  9:00 AM Baldo Daub, MD [facility] None    No orders of the defined types were placed in this encounter.   -------------------------------

## 2020-06-16 NOTE — Progress Notes (Signed)
Cardiology Office Note:    Date:  06/17/2020   ID:  Cameron Stout, DOB 1950/11/20, MRN [id number]  PCP:  Lupita Raider, MD  Cardiologist:  Norman Herrlich, MD    Referring MD: Lupita Raider, MD    ASSESSMENT:    1. Hyperlipidemia LDL goal <70   2. Thoracic aortic aneurysm without rupture (HCC)    PLAN:    In order of problems listed above:  1. Continue PCSK9 inhibitor Zetia recheck lipid CMP he has had no muscle symptoms 2. Recheck in 6 months if stable yearly does not have genetic aortopathy notes unlikely for these individuals to progress to correction   Next appointment: 1 year   Medication Adjustments/Labs and Tests Ordered: Current medicines are reviewed at length with the patient today.  Concerns regarding medicines are outlined above.  Orders Placed This Encounter  Procedures  . CT ANGIO CHEST AORTA W/CM & OR WO/CM  . Comprehensive metabolic panel  . Lipid panel  . Lipoprotein A (LPA)   No orders of the defined types were placed in this encounter.   No chief complaint on file.   History of Present Illness:    Cameron Stout is a 70 y.o. male with a hx of hyperlipidemia atherosclerosis with a coronary artery calcium score of 70-40 5th percentile for age and sex and fusiform aneurysmal dilation of the ascending aorta 45 mm on CT 01/30/2020.  Was last seen 03/02/2020.  He was placed on PCSK9 inhibitor for hyperlipidemia his last visit. Compliance with diet, lifestyle and medications: Yes  We reviewed the results of the CTA He has moderate enlargement ascending aorta no family history of aortopathy. We will plan to recheck 6 months and if stable yearly He tolerates PCSK9 therapy no muscle symptoms check lipid profile LP(a) today He has had no chest pain palpitation shortness of breath or edema   Past Medical History:  Diagnosis Date  . Bipolar disorder (HCC)   . GERD (gastroesophageal reflux disease)   . H/O polymyalgia rheumatica   . Pneumonia      Past Surgical History:  Procedure Laterality Date  . back injections     in S 1 joint  . COLONOSCOPY WITH PROPOFOL N/A 06/23/2014   Procedure: COLONOSCOPY WITH PROPOFOL;  Surgeon: Charolett Bumpers, MD;  Location: [facility];  Service: Endoscopy;  Laterality: N/A;  . HERNIA REPAIR     inguinal  . KNEE ARTHROSCOPY      Current Medications: Current Meds  Medication Sig  . aspirin EC 81 MG tablet Take 81 mg by mouth daily.  Marland Kitchen dexlansoprazole (DEXILANT) 60 MG capsule Take 60 mg by mouth daily.  . Evolocumab (REPATHA SURECLICK) 140 MG/ML SOAJ Inject 140 mg into the skin every 14 (fourteen) days.  Marland Kitchen ezetimibe (ZETIA) 10 MG tablet Take 10 mg by mouth every morning.  . loratadine (CLARITIN) 10 MG tablet Take 10 mg by mouth daily.  . OXcarbazepine (TRILEPTAL) 150 MG tablet 1 twice daily and 2 tablets at night (Patient taking differently: 1 in am and 2 tablets at night)     Allergies:   Atorvastatin   Social History   Socioeconomic History  . Marital status: Married    Spouse name: Not on file  . Number of children: Not on file  . Years of education: Not on file  . Highest education level: Not on file  Occupational History  . Not on file  Tobacco Use  . Smoking status: Never Smoker  . Smokeless tobacco: Never  Used  Substance and Sexual Activity  . Alcohol use: Yes    Comment: occassionally  . Drug use: No  . Sexual activity: Not on file  Other Topics Concern  . Not on file  Social History Narrative  . Not on file   Social Determinants of Health   Financial Resource Strain: Not on file  Food Insecurity: Not on file  Transportation Needs: Not on file  Physical Activity: Not on file  Stress: Not on file  Social Connections: Not on file     Family History: The patient's family history includes Heart attack in his brother and brother; Heart disease in his father; Hypertension in his mother; Obesity in his mother. ROS:   Please see the history of present illness.     All other systems reviewed and are negative.  EKGs/Labs/Other Studies Reviewed:    The following studies were reviewed today:  Lipid profile 12/16/2019 cholesterol 240 LDL 145 triglycerides 89 HDL 80   Physical Exam:    VS:  BP 122/62 (BP Location: Right Arm, Patient Position: Sitting, Cuff Size: Normal)   Pulse 62   Ht 5\' 11"  (1.803 m)   Wt 207 lb (93.9 kg)   SpO2 97%   BMI 28.87 kg/m     Wt Readings from Last 3 Encounters:  06/17/20 207 lb (93.9 kg)  03/02/20 206 lb (93.4 kg)  06/23/14 220 lb (99.8 kg)     GEN:  Well nourished, well developed in no acute distress HEENT: Normal NECK: No JVD; No carotid bruits LYMPHATICS: No lymphadenopathy CARDIAC: RRR, no murmurs, rubs, gallops RESPIRATORY:  Clear to auscultation without rales, wheezing or rhonchi  ABDOMEN: Soft, non-tender, non-distended MUSCULOSKELETAL:  No edema; No deformity  SKIN: Warm and dry NEUROLOGIC:  Alert and oriented x 3 PSYCHIATRIC:  Normal affect    Signed, 08/23/14, MD  06/17/2020 9:38 AM    [facility]

## 2020-06-17 ENCOUNTER — Ambulatory Visit: Payer: PPO | Admitting: Cardiology

## 2020-06-17 ENCOUNTER — Other Ambulatory Visit: Payer: Self-pay

## 2020-06-17 ENCOUNTER — Encounter: Payer: Self-pay | Admitting: Cardiology

## 2020-06-17 VITALS — BP 122/62 | HR 62 | Ht 71.0 in | Wt 207.0 lb

## 2020-06-17 DIAGNOSIS — E785 Hyperlipidemia, unspecified: Secondary | ICD-10-CM

## 2020-06-17 DIAGNOSIS — I712 Thoracic aortic aneurysm, without rupture, unspecified: Secondary | ICD-10-CM

## 2020-06-17 NOTE — Patient Instructions (Addendum)
Medication Instructions:  Your physician recommends that you continue on your current medications as directed. Please refer to the Current Medication list given to you today.  *If you need a refill on your cardiac medications before your next appointment, please call your pharmacy*   Lab Work: Your physician recommends that you return for lab work in: TODAY CMP, Lipids, Lpa If you have labs (blood work) drawn today and your tests are completely normal, you will receive your results only by: Marland Kitchen MyChart Message (if you have MyChart) OR . A paper copy in the mail If you have any lab test that is abnormal or we need to change your treatment, we will call you to review the results.   Testing/Procedures: CT-scan of the chest has been ordered to be completed in June 2022. We will call you to schedule this closer to time.   Follow-Up: At [facility], you and your health needs are our priority.  As part of our continuing mission to provide you with exceptional heart care, we have created designated Provider Care Teams.  These Care Teams include your primary Cardiologist (physician) and Advanced Practice Providers (APPs -  Physician Assistants and Nurse Practitioners) who all work together to provide you with the care you need, when you need it.  We recommend signing up for the patient portal called "MyChart".  Sign up information is provided on this After Visit Summary.  MyChart is used to connect with patients for Virtual Visits (Telemedicine).  Patients are able to view lab/test results, encounter notes, upcoming appointments, etc.  Non-urgent messages can be sent to your provider as well.   To learn more about what you can do with MyChart, go to [email or web address].    Your next appointment:   1 year(s)  The format for your next appointment:   In Person  Provider:   Norman Herrlich, MD   Other Instructions

## 2020-06-18 ENCOUNTER — Telehealth: Payer: Self-pay

## 2020-06-18 LAB — LIPID PANEL
Chol/HDL Ratio: 2 ratio (ref 0.0–5.0)
Cholesterol, Total: 142 mg/dL (ref 100–199)
HDL: 72 mg/dL (ref >39)
LDL Chol Calc (NIH): 57 mg/dL (ref 0–99)
Triglycerides: 61 mg/dL (ref 0–149)
VLDL Cholesterol Cal: 13 mg/dL (ref 5–40)

## 2020-06-18 LAB — COMPREHENSIVE METABOLIC PANEL
ALT: 18 IU/L (ref 0–44)
AST: 30 IU/L (ref 0–40)
Albumin/Globulin Ratio: 2.2 (ref 1.2–2.2)
Albumin: 4.7 g/dL (ref 3.8–4.8)
Alkaline Phosphatase: 74 IU/L (ref 44–121)
BUN/Creatinine Ratio: 23 (ref 10–24)
BUN: 23 mg/dL (ref 8–27)
Bilirubin Total: 0.5 mg/dL (ref 0.0–1.2)
CO2: 22 mmol/L (ref 20–29)
Calcium: 10.1 mg/dL (ref 8.6–10.2)
Chloride: 101 mmol/L (ref 96–106)
Creatinine, Ser: 0.99 mg/dL (ref 0.76–1.27)
Globulin, Total: 2.1 g/dL (ref 1.5–4.5)
Glucose: 92 mg/dL (ref 65–99)
Potassium: 4.9 mmol/L (ref 3.5–5.2)
Sodium: 139 mmol/L (ref 134–144)
Total Protein: 6.8 g/dL (ref 6.0–8.5)
eGFR: 83 mL/min/{1.73_m2} (ref >59)

## 2020-06-18 LAB — LIPOPROTEIN A (LPA): Lipoprotein (a): <8.4 nmol/L (ref <75.0)

## 2020-06-18 NOTE — Telephone Encounter (Signed)
-----   Message from Baldo Daub, MD sent at 06/18/2020  8:01 AM EDT ----- Good results no changes

## 2020-06-18 NOTE — Telephone Encounter (Signed)
Spoke with patient regarding results and recommendation.  Patient verbalizes understanding and is agreeable to plan of care. Advised patient to call back with any issues or concerns.  

## 2020-06-18 NOTE — Telephone Encounter (Signed)
-----   Message from Brian J Munley, MD sent at 06/18/2020  8:01 AM EDT ----- Good results no changes 

## 2020-06-18 NOTE — Telephone Encounter (Signed)
Left message on patients voicemail to please return our call.   

## 2020-07-02 DIAGNOSIS — M65341 Trigger finger, right ring finger: Secondary | ICD-10-CM | POA: Diagnosis not present

## 2020-07-16 DIAGNOSIS — Z79899 Other long term (current) drug therapy: Secondary | ICD-10-CM | POA: Diagnosis not present

## 2020-07-16 DIAGNOSIS — M65341 Trigger finger, right ring finger: Secondary | ICD-10-CM | POA: Diagnosis not present

## 2020-07-16 DIAGNOSIS — I712 Thoracic aortic aneurysm, without rupture: Secondary | ICD-10-CM | POA: Diagnosis not present

## 2020-07-26 DIAGNOSIS — J40 Bronchitis, not specified as acute or chronic: Secondary | ICD-10-CM | POA: Diagnosis not present

## 2020-07-26 DIAGNOSIS — R0602 Shortness of breath: Secondary | ICD-10-CM | POA: Diagnosis not present

## 2020-07-26 DIAGNOSIS — J309 Allergic rhinitis, unspecified: Secondary | ICD-10-CM | POA: Diagnosis not present

## 2020-07-30 DIAGNOSIS — R059 Cough, unspecified: Secondary | ICD-10-CM | POA: Diagnosis not present

## 2020-07-30 DIAGNOSIS — R0602 Shortness of breath: Secondary | ICD-10-CM | POA: Diagnosis not present

## 2020-07-30 DIAGNOSIS — J209 Acute bronchitis, unspecified: Secondary | ICD-10-CM | POA: Diagnosis not present

## 2020-08-24 DIAGNOSIS — M65341 Trigger finger, right ring finger: Secondary | ICD-10-CM | POA: Diagnosis not present

## 2020-08-24 DIAGNOSIS — M65841 Other synovitis and tenosynovitis, right hand: Secondary | ICD-10-CM | POA: Diagnosis not present

## 2020-08-24 DIAGNOSIS — M353 Polymyalgia rheumatica: Secondary | ICD-10-CM | POA: Diagnosis not present

## 2020-08-24 DIAGNOSIS — M65821 Other synovitis and tenosynovitis, right upper arm: Secondary | ICD-10-CM | POA: Diagnosis not present

## 2020-09-03 DIAGNOSIS — M65841 Other synovitis and tenosynovitis, right hand: Secondary | ICD-10-CM | POA: Diagnosis not present

## 2020-09-03 DIAGNOSIS — Z4802 Encounter for removal of sutures: Secondary | ICD-10-CM | POA: Diagnosis not present

## 2020-09-03 DIAGNOSIS — Z4789 Encounter for other orthopedic aftercare: Secondary | ICD-10-CM | POA: Diagnosis not present

## 2020-09-17 ENCOUNTER — Ambulatory Visit (HOSPITAL_BASED_OUTPATIENT_CLINIC_OR_DEPARTMENT_OTHER): Payer: PPO

## 2020-10-12 ENCOUNTER — Telehealth: Payer: Self-pay

## 2020-10-12 NOTE — Telephone Encounter (Signed)
PA started on CMM for Repatha. Key [id number].

## 2020-10-14 NOTE — Telephone Encounter (Signed)
PA approved on CMM for Repatha through 10/14/21.

## 2020-10-15 ENCOUNTER — Telehealth: Payer: Self-pay | Admitting: Cardiology

## 2020-10-15 DIAGNOSIS — I712 Thoracic aortic aneurysm, without rupture, unspecified: Secondary | ICD-10-CM

## 2020-10-15 NOTE — Telephone Encounter (Signed)
Pt is aware and lab has been scheduled.

## 2020-10-15 NOTE — Telephone Encounter (Signed)
Lawson Fiscal is calling from the [facility] stating Cameron Stout is needing labs before his CT. She is requesting an order be placed for Comprehensive metabolic panel that includes bun creatinine labs and GFR. She requested the pt be notified when orders are placed. Please advise.

## 2020-10-19 ENCOUNTER — Ambulatory Visit (HOSPITAL_BASED_OUTPATIENT_CLINIC_OR_DEPARTMENT_OTHER): Admission: RE | Admit: 2020-10-19 | Payer: PPO | Source: Ambulatory Visit

## 2020-11-02 ENCOUNTER — Encounter (HOSPITAL_BASED_OUTPATIENT_CLINIC_OR_DEPARTMENT_OTHER): Payer: Self-pay

## 2020-11-02 ENCOUNTER — Other Ambulatory Visit: Payer: Self-pay

## 2020-11-02 ENCOUNTER — Ambulatory Visit (HOSPITAL_BASED_OUTPATIENT_CLINIC_OR_DEPARTMENT_OTHER): Admission: RE | Admit: 2020-11-02 | Payer: PPO | Source: Ambulatory Visit

## 2020-11-02 DIAGNOSIS — I712 Thoracic aortic aneurysm, without rupture, unspecified: Secondary | ICD-10-CM

## 2020-11-03 ENCOUNTER — Telehealth: Payer: Self-pay

## 2020-11-03 LAB — BASIC METABOLIC PANEL
BUN/Creatinine Ratio: 27 — ABNORMAL HIGH (ref 10–24)
BUN: 26 mg/dL (ref 8–27)
CO2: 23 mmol/L (ref 20–29)
Calcium: 9.3 mg/dL (ref 8.6–10.2)
Chloride: 105 mmol/L (ref 96–106)
Creatinine, Ser: 0.95 mg/dL (ref 0.76–1.27)
Glucose: 87 mg/dL (ref 65–99)
Potassium: 4.4 mmol/L (ref 3.5–5.2)
Sodium: 140 mmol/L (ref 134–144)
eGFR: 87 mL/min/{1.73_m2} (ref >59)

## 2020-11-03 NOTE — Telephone Encounter (Signed)
Left message on patients voicemail to please return our call.   

## 2020-11-03 NOTE — Telephone Encounter (Signed)
-----   Message from Thomasene Ripple, DO sent at 11/03/2020  9:58 AM EDT ----- This is Dr. Hulen Shouts patient: Labs stable.  Your BUN is slightly elevated than 4 months ago this could be due to dehydration.  Increase fluid intake.

## 2020-11-04 ENCOUNTER — Telehealth: Payer: Self-pay

## 2020-11-04 NOTE — Telephone Encounter (Signed)
Spoke with patient regarding results and recommendation.  Patient verbalizes understanding and is agreeable to plan of care. Advised patient to call back with any issues or concerns.  

## 2020-11-04 NOTE — Telephone Encounter (Signed)
-----   Message from Kardie Tobb, DO sent at 11/03/2020  9:58 AM EDT ----- This is Dr. Munley's patient: Labs stable.  Your BUN is slightly elevated than 4 months ago this could be due to dehydration.  Increase fluid intake. 

## 2020-11-06 ENCOUNTER — Other Ambulatory Visit: Payer: Self-pay

## 2020-11-06 ENCOUNTER — Ambulatory Visit (HOSPITAL_BASED_OUTPATIENT_CLINIC_OR_DEPARTMENT_OTHER)
Admission: RE | Admit: 2020-11-06 | Discharge: 2020-11-06 | Disposition: A | Discharge status: Home / Self Care | Payer: PPO | Source: Ambulatory Visit | Attending: Cardiology | Admitting: Cardiology

## 2020-11-06 DIAGNOSIS — I700 Atherosclerosis of aorta: Secondary | ICD-10-CM | POA: Diagnosis not present

## 2020-11-06 DIAGNOSIS — I712 Thoracic aortic aneurysm, without rupture, unspecified: Secondary | ICD-10-CM

## 2020-11-06 MED ORDER — IOHEXOL 350 MG/ML SOLN
100.0000 mL | Freq: Once | INTRAVENOUS | Status: AC | PRN
  Administered 2020-11-06: 100 mL via INTRAVENOUS

## 2020-11-08 DIAGNOSIS — H00015 Hordeolum externum left lower eyelid: Secondary | ICD-10-CM | POA: Diagnosis not present

## 2020-11-08 DIAGNOSIS — H00036 Abscess of eyelid left eye, unspecified eyelid: Secondary | ICD-10-CM | POA: Diagnosis not present

## 2020-11-10 DIAGNOSIS — H0015 Chalazion left lower eyelid: Secondary | ICD-10-CM | POA: Diagnosis not present

## 2020-11-18 ENCOUNTER — Ambulatory Visit: Payer: PPO | Admitting: Cardiology

## 2020-11-18 ENCOUNTER — Encounter: Payer: Self-pay | Admitting: Cardiology

## 2020-11-18 ENCOUNTER — Other Ambulatory Visit: Payer: Self-pay

## 2020-11-18 VITALS — BP 114/88 | HR 58 | Ht 71.0 in | Wt 206.0 lb

## 2020-11-18 DIAGNOSIS — E785 Hyperlipidemia, unspecified: Secondary | ICD-10-CM | POA: Diagnosis not present

## 2020-11-18 DIAGNOSIS — I712 Thoracic aortic aneurysm, without rupture, unspecified: Secondary | ICD-10-CM

## 2020-11-18 DIAGNOSIS — R931 Abnormal findings on diagnostic imaging of heart and coronary circulation: Secondary | ICD-10-CM

## 2020-11-18 NOTE — Patient Instructions (Signed)
Medication Instructions:  Your physician recommends that you continue on your current medications as directed. Please refer to the Current Medication list given to you today.  *If you need a refill on your cardiac medications before your next appointment, please call your pharmacy*   Lab Work: None If you have labs (blood work) drawn today and your tests are completely normal, you will receive your results only by: MyChart Message (if you have MyChart) OR A paper copy in the mail If you have any lab test that is abnormal or we need to change your treatment, we will call you to review the results.   Testing/Procedures: We have placed an order for you to have a ct of your chest completed in one year. Closer to time they will call you to schedule this appointment.    Follow-Up: At [facility], you and your health needs are our priority.  As part of our continuing mission to provide you with exceptional heart care, we have created designated Provider Care Teams.  These Care Teams include your primary Cardiologist (physician) and Advanced Practice Providers (APPs -  Physician Assistants and Nurse Practitioners) who all work together to provide you with the care you need, when you need it.  We recommend signing up for the patient portal called "MyChart".  Sign up information is provided on this After Visit Summary.  MyChart is used to connect with patients for Virtual Visits (Telemedicine).  Patients are able to view lab/test results, encounter notes, upcoming appointments, etc.  Non-urgent messages can be sent to your provider as well.   To learn more about what you can do with MyChart, go to [email or web address].    Your next appointment:   1 year(s)  The format for your next appointment:   In Person  Provider:   Norman Herrlich, MD   Other Instructions

## 2020-11-18 NOTE — Progress Notes (Signed)
Cardiology Office Note:    Date:  11/18/2020   ID:  Cameron Stout, DOB 07/01/1950, MRN [id number]  PCP:  Lupita Raider, MD  Cardiologist:  Norman Herrlich, MD    Referring MD: Lupita Raider, MD    ASSESSMENT:    1. Thoracic aortic aneurysm without rupture (HCC)   2. Hyperlipidemia LDL goal <70   3. Agatston coronary artery calcium score less than 100    PLAN:    In order of problems listed above:  His aortopathy remained stable there is no family history of think he needs genetic testing and with his heart rate less than 60 and blood pressure low normal we will put him on a beta-blocker and antihypertensive therapy.  We will plan to recheck a CT in 1 year no contrast see me afterwards.  No restriction on activities Ideal lipids continue combined Repatha and Zetia therapy.   Next appointment: 1 year after follow-up noncontrast CT   Medication Adjustments/Labs and Tests Ordered: Current medicines are reviewed at length with the patient today.  Concerns regarding medicines are outlined above.  No orders of the defined types were placed in this encounter.  No orders of the defined types were placed in this encounter.   Chief Complaint  Patient presents with   Follow-up   Thoracic Aortic Aneurysm    History of Present Illness:    Cameron Stout is a 70 y.o. male with a hx of coronary atherosclerosis with a calcium score of 70 45th percentile and fusiform enlargement ascending aorta 45 mm since CT November 2021 last seen March 2022.  Compliance with diet, lifestyle and medications: Yes  Remains very active issues future trip to [place]. No cardiovascular symptoms of edema chest pain shortness of breath palpitations syncope He tolerates lipid-lowering from ideal LDL without side effects from his Repatha and Zetia. He has no family history of aortopathy and is dilation of ascending aorta remained stable at 40 mm.  He had CT a chest 11/06/2020 showing stable  aneurysmal dilation of ascending aorta at 44 mm. Past Medical History:  Diagnosis Date   Bipolar disorder (HCC)    GERD (gastroesophageal reflux disease)    H/O polymyalgia rheumatica    Pneumonia     Past Surgical History:  Procedure Laterality Date   back injections     in S 1 joint   COLONOSCOPY WITH PROPOFOL N/A 06/23/2014   Procedure: COLONOSCOPY WITH PROPOFOL;  Surgeon: Charolett Bumpers, MD;  Location: [facility];  Service: Endoscopy;  Laterality: N/A;   HERNIA REPAIR     inguinal   KNEE ARTHROSCOPY      Current Medications: Current Meds  Medication Sig   aspirin EC 81 MG tablet Take 81 mg by mouth daily.   dexlansoprazole (DEXILANT) 60 MG capsule Take 60 mg by mouth daily.   Evolocumab (REPATHA SURECLICK) 140 MG/ML SOAJ Inject 140 mg into the skin every 14 (fourteen) days.   ezetimibe (ZETIA) 10 MG tablet Take 10 mg by mouth every morning.   loratadine (CLARITIN) 10 MG tablet Take 10 mg by mouth daily.   OXcarbazepine (TRILEPTAL) 150 MG tablet 1 twice daily and 2 tablets at night (Patient taking differently: 1 in am and 2 tablets at night)     Allergies:   Atorvastatin   Social History   Socioeconomic History   Marital status: Married    Spouse name: Not on file   Number of children: Not on file   Years of education: Not on file  Highest education level: Not on file  Occupational History   Not on file  Tobacco Use   Smoking status: Never   Smokeless tobacco: Never  Substance and Sexual Activity   Alcohol use: Yes    Comment: occassionally   Drug use: No   Sexual activity: Not on file  Other Topics Concern   Not on file  Social History Narrative   Not on file   Social Determinants of Health   Financial Resource Strain: Not on file  Food Insecurity: Not on file  Transportation Needs: Not on file  Physical Activity: Not on file  Stress: Not on file  Social Connections: Not on file     Family History: The patient's family history includes Heart  attack in his brother and brother; Heart disease in his father; Hypertension in his mother; Obesity in his mother. ROS:   Please see the history of present illness.    All other systems reviewed and are negative.  EKGs/Labs/Other Studies Reviewed:    The following studies were reviewed today:    Recent Labs: 06/17/2020: ALT 18 11/02/2020: BUN 26; Creatinine, Ser 0.95; Potassium 4.4; Sodium 140  Recent Lipid Panel    Component Value Date/Time   CHOL 142 06/17/2020 0943   TRIG 61 06/17/2020 0943   HDL 72 06/17/2020 0943   CHOLHDL 2.0 06/17/2020 0943   LDLCALC 57 06/17/2020 0943    Physical Exam:    VS:  BP 114/88 (BP Location: Right Arm, Patient Position: Sitting)   Pulse (!) 58   Ht 5\' 11"  (1.803 m)   Wt 206 lb (93.4 kg)   SpO2 98%   BMI 28.73 kg/m     Wt Readings from Last 3 Encounters:  11/18/20 206 lb (93.4 kg)  06/17/20 207 lb (93.9 kg)  03/02/20 206 lb (93.4 kg)     GEN:  Well nourished, well developed in no acute distress HEENT: Normal NECK: No JVD; No carotid bruits LYMPHATICS: No lymphadenopathy CARDIAC: RRR, no murmurs, rubs, gallops RESPIRATORY:  Clear to auscultation without rales, wheezing or rhonchi  ABDOMEN: Soft, non-tender, non-distended MUSCULOSKELETAL:  No edema; No deformity  SKIN: Warm and dry NEUROLOGIC:  Alert and oriented x 3 PSYCHIATRIC:  Normal affect    Signed, 03/04/20, MD  11/18/2020 9:25 AM    [facility]

## 2020-11-24 ENCOUNTER — Other Ambulatory Visit: Payer: Self-pay | Admitting: Cardiology

## 2020-12-03 DIAGNOSIS — M7521 Bicipital tendinitis, right shoulder: Secondary | ICD-10-CM | POA: Diagnosis not present

## 2020-12-03 DIAGNOSIS — M25511 Pain in right shoulder: Secondary | ICD-10-CM | POA: Diagnosis not present

## 2020-12-03 DIAGNOSIS — M7541 Impingement syndrome of right shoulder: Secondary | ICD-10-CM | POA: Diagnosis not present

## 2020-12-09 ENCOUNTER — Encounter: Payer: Self-pay | Admitting: Psychiatry

## 2020-12-09 ENCOUNTER — Other Ambulatory Visit: Payer: Self-pay

## 2020-12-09 ENCOUNTER — Ambulatory Visit (INDEPENDENT_AMBULATORY_CARE_PROVIDER_SITE_OTHER): Payer: PPO | Admitting: Psychiatry

## 2020-12-09 DIAGNOSIS — F39 Unspecified mood [affective] disorder: Secondary | ICD-10-CM | POA: Diagnosis not present

## 2020-12-09 DIAGNOSIS — F5105 Insomnia due to other mental disorder: Secondary | ICD-10-CM

## 2020-12-09 DIAGNOSIS — F418 Other specified anxiety disorders: Secondary | ICD-10-CM

## 2020-12-09 MED ORDER — PROPRANOLOL HCL 20 MG PO TABS: ORAL_TABLET | ORAL | 1 refills | Status: DC

## 2020-12-09 NOTE — Progress Notes (Signed)
Cameron Stout [id number] 04/24/1950 70 y.o.  Subjective:   Patient ID:  Cameron Stout is a 70 y.o. (DOB 07-23-1950) male.  Chief Complaint:  Chief Complaint  Patient presents with   Follow-up   Episodic mood disorder [facility])    HPI Cameron Stout presents to the office today for follow-up of episodic mood disorder.  seen Sept 2020.  No meds were changed.  He remained on Trileptal 150 mg tablet one half every morning and 2 tablets every afternoon, Lunesta 2 mg nightly as needed insomnia  05/24/19 appt with the following noted: Forcing to partner or join big group and it's stresful and trying to decide what to do.  Affects his sleep.  Parnter not handling it well.  Lunesta helps.   Disc retirement and need for something to do when he cuts back work.   He plans to gradually cut back and not stop. Overall OK.  During high stress March-May increased Trileptal 2 tab BID and then able to cut it back.  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  Big decision.  Plans to only work 3 days/week.  Plans to continue that for 6 mos and reassess. Busy with side businesses rental houses.   Mood has been stable.  Stress can affect sleep and some irritability at times. Still rides motorcycle for fun.  No complaints with meds. 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  Some patients upset over his retirement.  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   Remodeling house.  Staying busy mostly.  Exercising and going to gym.  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   Taking bike trips now with a group.  Has some trips.  Mood is better now that transition is further down the road.  Mood is fine.  Tends to be emotional easily.  12/09/20 appt noted: Slowed down in work but 3 days per week.  Took yearly  trip to [place].  D moving back from [place]. Going to [place].  Doing fine.  No complaints. Upcoming speaking  situations generating anxiety.  Patient reports stable mood and denies depressed but mild manageable irritable moods.  Patient denies any recent difficulty with anxiety.  Patient denies difficulty with sleep initiation or maintenance. Rare Lunesta. Average 6 hours sleep is good.  Sleep is good generally unless occ pain. Denies appetite disturbance.  Patient reports that energy and motivation have been good.  Patient denies any difficulty with concentration. Patient denies any suicidal ideation.  Past Psychiatric Medication Trials: Lithium 750, Depakote, Equetro, Trileptal since 2015, Lunesta, propranolol, fluoxetine, sertraline, ProSom Under care of this practice since 2007  Review of Systems:  Review of Systems  Constitutional:  Negative for fatigue.  Cardiovascular:  Negative for palpitations.  Neurological:  Negative for dizziness, tremors and weakness.   Medications: I have reviewed the patient's current medications.  Current Outpatient Medications  Medication Sig Dispense Refill   aspirin EC 81 MG tablet Take 81 mg by mouth daily.     dexlansoprazole (DEXILANT) 60 MG capsule Take 60 mg by mouth daily.     Evolocumab (REPATHA SURECLICK) 140 MG/ML SOAJ INJECT 140 MG INTO THE SKIN EVERY 14 (FOURTEEN) DAYS. 2 mL 6   ezetimibe (ZETIA) 10 MG tablet Take 10 mg by mouth every morning.     loratadine (CLARITIN) 10 MG tablet Take 10 mg by mouth daily.  OXcarbazepine (TRILEPTAL) 150 MG tablet 1 twice daily and 2 tablets at night (Patient taking differently: 1 in am and 2 tablets at night) 360 tablet 2   propranolol (INDERAL) 20 MG tablet Take 1-2 tabs po BID prn anxiety 60 tablet 1   No current facility-administered medications for this visit.    Medication Side Effects: None  Allergies:  Allergies  Allergen Reactions   Atorvastatin Other (See Comments)    He developed polymyalgia rheumatica was on long-term steroids for 5 years and could not take a statin again    Past Medical  History:  Diagnosis Date   Bipolar disorder (HCC)    GERD (gastroesophageal reflux disease)    H/O polymyalgia rheumatica    Pneumonia     Family History  Problem Relation Age of Onset   Hypertension Mother    Obesity Mother    Heart disease Father    Heart attack Brother    Heart attack Brother     Social History   Socioeconomic History   Marital status: Married    Spouse name: Not on file   Number of children: Not on file   Years of education: Not on file   Highest education level: Not on file  Occupational History   Not on file  Tobacco Use   Smoking status: Never   Smokeless tobacco: Never  Substance and Sexual Activity   Alcohol use: Yes    Comment: occassionally   Drug use: No   Sexual activity: Not on file  Other Topics Concern   Not on file  Social History Narrative   Not on file   Social Determinants of Health   Financial Resource Strain: Not on file  Food Insecurity: Not on file  Transportation Needs: Not on file  Physical Activity: Not on file  Stress: Not on file  Social Connections: Not on file  Intimate Partner Violence: Not on file    Past Medical History, Surgical history, Social history, and Family history were reviewed and updated as appropriate.   Please see review of systems for further details on the patient's review from today.   Objective:   Physical Exam:  There were no vitals taken for this visit.  Physical Exam Constitutional:      General: He is not in acute distress.    Appearance: He is well-developed.  Musculoskeletal:        General: No deformity.  Neurological:     Mental Status: He is alert and oriented to person, place, and time.     Coordination: Coordination normal.  Psychiatric:        Attention and Perception: Attention and perception normal. He does not perceive auditory or visual hallucinations.        Mood and Affect: Mood is not anxious or depressed. Affect is not labile, blunt, angry or inappropriate.         Speech: Speech normal. Speech is not slurred.        Behavior: Behavior normal. Behavior is not slowed.        Thought Content: Thought content normal. Thought content is not paranoid or delusional. Thought content does not include homicidal or suicidal ideation. Thought content does not include homicidal or suicidal plan.        Cognition and Memory: Cognition and memory normal.        Judgment: Judgment normal.     Comments: Insight intact    Lab Review:     Component Value Date/Time   NA 140  11/02/2020 1105   K 4.4 11/02/2020 1105   CL 105 11/02/2020 1105   CO2 23 11/02/2020 1105   GLUCOSE 87 11/02/2020 1105   BUN 26 11/02/2020 1105   CREATININE 0.95 11/02/2020 1105   CALCIUM 9.3 11/02/2020 1105   PROT 6.8 06/17/2020 0943   ALBUMIN 4.7 06/17/2020 0943   AST 30 06/17/2020 0943   ALT 18 06/17/2020 0943   ALKPHOS 74 06/17/2020 0943   BILITOT 0.5 06/17/2020 0943    No results found for: WBC, RBC, HGB, HCT, PLT, MCV, MCH, MCHC, RDW, LYMPHSABS, MONOABS, EOSABS, BASOSABS  No results found for: POCLITH, LITHIUM   No results found for: PHENYTOIN, PHENOBARB, VALPROATE, CBMZ   .res Assessment: Plan:    Cameron Stout was seen today for follow-up and episodic mood disorder (hcc).  Diagnoses and all orders for this visit:  Episodic mood disorder (HCC)  Insomnia due to mental condition  Performance anxiety  Other orders -     propranolol (INDERAL) 20 MG tablet; Take 1-2 tabs po BID prn anxiety   Dx questions chronically between episodic mood disorder, bipolar type II, or atypical depression.  As noted he is tried several different medications and has responded best to Trileptal.  His primary symptom has been irritability and a history of anger outbursts that affected him at both work and home.  These have generally been managed well with the Trileptal.  He has been on Trileptal since 2015.  He is tolerating it well.  He had a bout with probably myalgia rheumatica which lasted  for 3 years.  He is resolved he has been able to get off prednisone.  An autoimmune diet by Dr. Phillis Haggis has been very helpful.  He is exercising and that helps both his mood and his physical conditioning. Kept off 25 # he lost a couple years ago.  Generally sleeping well without insists Lunesta but as needed on occasion.  Disc dependence and tolerance and how to do it.    Discussed his gradual slow transition into retirement and the need to develop hobbies for mental health reasons.  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 Supportive therapy on dealing with this. Dealing with transition into retirement.  No med changes indicated .  Option increase Trileptal to 300 BID during the stress.  Propranolol prn for perfoming anxiety  6 mos  Meredith Staggers, MD, DFAPA  Please see After Visit Summary for patient specific instructions.  No future appointments.   No orders of the defined types were placed in this encounter.   -------------------------------

## 2020-12-28 ENCOUNTER — Other Ambulatory Visit: Payer: Self-pay | Admitting: Psychiatry

## 2020-12-28 DIAGNOSIS — I2510 Atherosclerotic heart disease of native coronary artery without angina pectoris: Secondary | ICD-10-CM | POA: Diagnosis not present

## 2020-12-28 DIAGNOSIS — F319 Bipolar disorder, unspecified: Secondary | ICD-10-CM | POA: Diagnosis not present

## 2020-12-28 DIAGNOSIS — Z125 Encounter for screening for malignant neoplasm of prostate: Secondary | ICD-10-CM | POA: Diagnosis not present

## 2020-12-28 DIAGNOSIS — Z23 Encounter for immunization: Secondary | ICD-10-CM | POA: Diagnosis not present

## 2020-12-28 DIAGNOSIS — E782 Mixed hyperlipidemia: Secondary | ICD-10-CM | POA: Diagnosis not present

## 2020-12-28 DIAGNOSIS — I712 Thoracic aortic aneurysm, without rupture, unspecified: Secondary | ICD-10-CM | POA: Diagnosis not present

## 2020-12-28 DIAGNOSIS — M353 Polymyalgia rheumatica: Secondary | ICD-10-CM | POA: Diagnosis not present

## 2020-12-28 DIAGNOSIS — M8580 Other specified disorders of bone density and structure, unspecified site: Secondary | ICD-10-CM | POA: Diagnosis not present

## 2020-12-28 DIAGNOSIS — Z0000 Encounter for general adult medical examination without abnormal findings: Secondary | ICD-10-CM | POA: Diagnosis not present

## 2020-12-28 DIAGNOSIS — K219 Gastro-esophageal reflux disease without esophagitis: Secondary | ICD-10-CM | POA: Diagnosis not present

## 2020-12-28 DIAGNOSIS — Z1159 Encounter for screening for other viral diseases: Secondary | ICD-10-CM | POA: Diagnosis not present

## 2021-01-07 ENCOUNTER — Other Ambulatory Visit: Payer: Self-pay | Admitting: Psychiatry

## 2021-01-07 DIAGNOSIS — F5105 Insomnia due to other mental disorder: Secondary | ICD-10-CM

## 2021-01-08 DIAGNOSIS — J019 Acute sinusitis, unspecified: Secondary | ICD-10-CM | POA: Diagnosis not present

## 2021-01-08 DIAGNOSIS — Z20822 Contact with and (suspected) exposure to covid-19: Secondary | ICD-10-CM | POA: Diagnosis not present

## 2021-01-08 DIAGNOSIS — J029 Acute pharyngitis, unspecified: Secondary | ICD-10-CM | POA: Diagnosis not present

## 2021-01-08 DIAGNOSIS — J208 Acute bronchitis due to other specified organisms: Secondary | ICD-10-CM | POA: Diagnosis not present

## 2021-01-08 DIAGNOSIS — B9789 Other viral agents as the cause of diseases classified elsewhere: Secondary | ICD-10-CM | POA: Diagnosis not present

## 2021-01-08 DIAGNOSIS — R0989 Other specified symptoms and signs involving the circulatory and respiratory systems: Secondary | ICD-10-CM | POA: Diagnosis not present

## 2021-01-12 DIAGNOSIS — L720 Epidermal cyst: Secondary | ICD-10-CM | POA: Diagnosis not present

## 2021-01-12 DIAGNOSIS — L570 Actinic keratosis: Secondary | ICD-10-CM | POA: Diagnosis not present

## 2021-01-12 DIAGNOSIS — X32XXXS Exposure to sunlight, sequela: Secondary | ICD-10-CM | POA: Diagnosis not present

## 2021-01-12 DIAGNOSIS — L814 Other melanin hyperpigmentation: Secondary | ICD-10-CM | POA: Diagnosis not present

## 2021-01-30 ENCOUNTER — Other Ambulatory Visit: Payer: Self-pay | Admitting: Psychiatry

## 2021-02-15 ENCOUNTER — Other Ambulatory Visit: Payer: Self-pay | Admitting: Psychiatry

## 2021-02-19 DIAGNOSIS — G8929 Other chronic pain: Secondary | ICD-10-CM | POA: Diagnosis not present

## 2021-02-19 DIAGNOSIS — M1711 Unilateral primary osteoarthritis, right knee: Secondary | ICD-10-CM | POA: Diagnosis not present

## 2021-02-19 DIAGNOSIS — M25561 Pain in right knee: Secondary | ICD-10-CM | POA: Diagnosis not present

## 2021-02-20 DIAGNOSIS — M25561 Pain in right knee: Secondary | ICD-10-CM | POA: Insufficient documentation

## 2021-02-20 DIAGNOSIS — G8929 Other chronic pain: Secondary | ICD-10-CM

## 2021-02-20 HISTORY — DX: Other chronic pain: G89.29

## 2021-02-21 ENCOUNTER — Emergency Department (HOSPITAL_BASED_OUTPATIENT_CLINIC_OR_DEPARTMENT_OTHER)
Admission: EM | Admit: 2021-02-21 | Discharge: 2021-02-21 | Disposition: A | Discharge status: Home / Self Care | Payer: PPO | Attending: Emergency Medicine | Admitting: Emergency Medicine

## 2021-02-21 ENCOUNTER — Encounter (HOSPITAL_BASED_OUTPATIENT_CLINIC_OR_DEPARTMENT_OTHER): Payer: Self-pay

## 2021-02-21 ENCOUNTER — Other Ambulatory Visit: Payer: Self-pay

## 2021-02-21 ENCOUNTER — Emergency Department (HOSPITAL_BASED_OUTPATIENT_CLINIC_OR_DEPARTMENT_OTHER): Payer: PPO

## 2021-02-21 DIAGNOSIS — M7989 Other specified soft tissue disorders: Secondary | ICD-10-CM | POA: Diagnosis not present

## 2021-02-21 DIAGNOSIS — R600 Localized edema: Secondary | ICD-10-CM | POA: Diagnosis not present

## 2021-02-21 DIAGNOSIS — L03011 Cellulitis of right finger: Secondary | ICD-10-CM | POA: Diagnosis not present

## 2021-02-21 DIAGNOSIS — M79644 Pain in right finger(s): Secondary | ICD-10-CM | POA: Diagnosis not present

## 2021-02-21 DIAGNOSIS — Z7982 Long term (current) use of aspirin: Secondary | ICD-10-CM | POA: Diagnosis not present

## 2021-02-21 DIAGNOSIS — R2231 Localized swelling, mass and lump, right upper limb: Secondary | ICD-10-CM | POA: Diagnosis present

## 2021-02-21 MED ORDER — CEPHALEXIN 500 MG PO CAPS: 500.0000 mg | ORAL_CAPSULE | Freq: Four times a day (QID) | ORAL | 0 refills | Status: AC

## 2021-02-21 NOTE — ED Provider Notes (Signed)
[facility] EMERGENCY DEPARTMENT Provider Note   CSN: [id number] Arrival date & time: 02/21/21  1401     History Chief Complaint  Patient presents with   Finger Swelling    Cameron Stout is a 70 y.o. male.  HPI Patient is 70 year old male with past medical history detailed below presented to the ER today with complaints of right thumb pain he states that he knows it was swollen and painful and red this morning when he first woke up.  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  He has no history of diabetes HIV or any immunosuppressive disease he is not on Plaquenil or any immunosuppressive medications that he knows of.  Denies any fevers chills cough congestion weakness fatigue or malaise.  No other associate symptoms.  He describes the pain is achy constant and mild.     Past Medical History:  Diagnosis Date   Bipolar disorder (HCC)    GERD (gastroesophageal reflux disease)    H/O polymyalgia rheumatica    Pneumonia     Patient Active Problem List   Diagnosis Date Noted   Pneumonia    H/O polymyalgia rheumatica    GERD (gastroesophageal reflux disease)    Bipolar disorder (HCC)    Mixed hyperlipidemia 03/12/2016   Hyperlipidemia LDL goal <70 05/21/2009   BRONCHITIS 05/21/2009    Past Surgical History:  Procedure Laterality Date   back injections     in S 1 joint   COLONOSCOPY WITH PROPOFOL N/A 06/23/2014   Procedure: COLONOSCOPY WITH PROPOFOL;  Surgeon: Charolett Bumpers, MD;  Location: [facility];  Service: Endoscopy;  Laterality: N/A;   HERNIA REPAIR     inguinal   KNEE ARTHROSCOPY         Family History  Problem Relation Age of Onset   Hypertension Mother    Obesity Mother    Heart disease Father    Heart attack Brother    Heart attack Brother     Social History   Tobacco Use    Smoking status: Never   Smokeless tobacco: Never  Substance Use Topics   Alcohol use: Yes    Comment: occassionally   Drug use: No    Home Medications Prior to Admission medications   Medication Sig Start Date End Date Taking? Authorizing Provider  cephALEXin (KEFLEX) 500 MG capsule Take 1 capsule (500 mg total) by mouth 4 (four) times daily for 7 days. 02/21/21 02/28/21 Yes Gailen Shelter, PA  aspirin EC 81 MG tablet Take 81 mg by mouth daily.    [provider]  dexlansoprazole (DEXILANT) 60 MG capsule Take 60 mg by mouth daily.    [provider]  Eszopiclone 3 MG TABS TAKE 1 TABLET (3 MG TOTAL) BY MOUTH AT BEDTIME. TAKE IMMEDIATELY BEFORE BEDTIME 01/07/21   Cottle, Steva Ready., MD  Evolocumab (REPATHA SURECLICK) 140 MG/ML SOAJ INJECT 140 MG INTO THE SKIN EVERY 14 (FOURTEEN) DAYS. 11/24/20   Baldo Daub, MD  ezetimibe (ZETIA) 10 MG tablet Take 10 mg by mouth every morning.    [provider]  loratadine (CLARITIN) 10 MG tablet Take 10 mg by mouth daily.    [provider]  OXcarbazepine (TRILEPTAL) 150 MG tablet 1 twice daily and 2 tablets at night Patient taking differently: 1 in am and 2 tablets  at night 12/13/19   Cottle, Steva Ready., MD  propranolol (INDERAL) 20 MG tablet TAKE 1 TO 2 TABLETS BY MOUTH TWICE A DAY AS NEEDED FOR ANXIETY 02/15/21   Cottle, Steva Ready., MD    Allergies    Atorvastatin  Review of Systems   Review of Systems  Constitutional:  Negative for fever.  HENT:  Negative for congestion.   Respiratory:  Negative for shortness of breath.   Cardiovascular:  Negative for chest pain.  Gastrointestinal:  Negative for abdominal distention.  Musculoskeletal:        Right thumb pain and swelling  Neurological:  Negative for dizziness and headaches.   Physical Exam Updated Vital Signs BP 133/89   Pulse (!) 55   Temp 97.8 F (36.6 C) (Oral)   Resp 16   Ht 5\' 10"  (1.778 m)   Wt 88.5 kg   SpO2 100%   BMI 27.98 kg/m    Physical Exam Vitals and nursing note reviewed.  Constitutional:      General: He is not in acute distress.    Appearance: Normal appearance. He is not ill-appearing.  HENT:     Head: Normocephalic and atraumatic.  Eyes:     General: No scleral icterus.       Right eye: No discharge.        Left eye: No discharge.     Conjunctiva/sclera: Conjunctivae normal.  Pulmonary:     Effort: Pulmonary effort is normal.     Breath sounds: No stridor.  Skin:    General: Skin is warm and dry.     Comments: Right thumb with slightly swollen slightly red in appearance.  Not particularly tender to touch.  There is a puncture wound on the distal pad palmar aspect where splinter previously was.  There is no evidence of foreign body.  Good cap refill.  No evidence of felon.  No paronychia on the dorsal aspect of the thumb.  Full range of motion of all joints of finger.  Neurological:     Mental Status: He is alert and oriented to person, place, and time. Mental status is at baseline.       ED Results / Procedures / Treatments   Labs (all labs ordered are listed, but only abnormal results are displayed) Labs Reviewed - No data to display  EKG None  Radiology DG Finger Thumb Right  Result Date: 02/21/2021 CLINICAL DATA:  Pain and swelling, splinter injury yesterday, splinter removed EXAM: RIGHT THUMB 2+V COMPARISON:  None. FINDINGS: There is no evidence of fracture or dislocation. There is no evidence of arthropathy or other focal bone abnormality. Soft tissue edema about the thumb. IMPRESSION: No fracture or dislocation of the right thumb. Soft tissue edema. No radiopaque foreign body. Electronically Signed   By: 14/06/2020 M.D.   On: 02/21/2021 16:51    Procedures Procedures   Medications Ordered in ED Medications - No data to display  ED Course  I have reviewed the triage vital signs and the nursing notes.  Pertinent labs & imaging results that were available during my care of the  patient were reviewed by me and considered in my medical decision making (see chart for details).    MDM Rules/Calculators/A&P                          Patient 70 year old male presented today with swelling and redness to his right thumb concern for superficial infection we will  treat with Keflex and recommend follow-up with hand surgery.  Given information for [facility] he will call Monday to make an appointment.  No evidence of felon or paronychia on my examination.  Cap refill intact good sensation good movement.  No fusiform swelling or flexor tendon discomfort with palpation.  Doubt flexor tenosynovitis or other emergent hand infection.  He states he is up-to-date on tetanus.  Will discharge home at this time with antibiotics.  Return precautions given.  X-ray obtained personally reviewed agree with radiology read no free air or significant abnormality apart from some soft tissue edema consistent with cellulitis.  Discussed with my attending physician.   Final Clinical Impression(s) / ED Diagnoses Final diagnoses:  Cellulitis of right thumb    Rx / DC Orders ED Discharge Orders          Ordered    cephALEXin (KEFLEX) 500 MG capsule  4 times daily        02/21/21 1627             Solon Augusta [place], [place] 02/21/21 1759    Alvira Monday, MD 02/22/21 1435

## 2021-02-21 NOTE — ED Triage Notes (Signed)
Pt c/o pain and swelling to right thumb starting this morning. States had a splinter yesterday afternoon. Denies fever

## 2021-02-21 NOTE — ED Notes (Signed)
Pt reports that had splinter in right thumb yesterday, splinter was removed. This AM soaked finger due to finger continuing to swell, redness advancing. Margin for redness marked with skin marker on arrival to stretcher.

## 2021-02-21 NOTE — Discharge Instructions (Addendum)
Please take antibiotics for the entire course that it is prescribed.  Take Tylenol for pain 1000 mg every 6 hours.  It is reasonable to take a picture of your thumb once daily to keep track of whether it is improving or worsening.  Follow-up with a hand surgeon at [facility] in 4 to 8 days.  You may always return to the emergency room for any new or concerning symptoms.

## 2021-03-18 DIAGNOSIS — M1711 Unilateral primary osteoarthritis, right knee: Secondary | ICD-10-CM | POA: Insufficient documentation

## 2021-03-18 HISTORY — DX: Unilateral primary osteoarthritis, right knee: M17.11

## 2021-03-19 DIAGNOSIS — H2513 Age-related nuclear cataract, bilateral: Secondary | ICD-10-CM | POA: Diagnosis not present

## 2021-03-19 DIAGNOSIS — H0015 Chalazion left lower eyelid: Secondary | ICD-10-CM | POA: Diagnosis not present

## 2021-03-29 DIAGNOSIS — M7541 Impingement syndrome of right shoulder: Secondary | ICD-10-CM | POA: Diagnosis not present

## 2021-03-29 DIAGNOSIS — M7521 Bicipital tendinitis, right shoulder: Secondary | ICD-10-CM | POA: Insufficient documentation

## 2021-03-29 HISTORY — DX: Bicipital tendinitis, right shoulder: M75.21

## 2021-04-13 DIAGNOSIS — H35371 Puckering of macula, right eye: Secondary | ICD-10-CM | POA: Diagnosis not present

## 2021-04-13 DIAGNOSIS — H0015 Chalazion left lower eyelid: Secondary | ICD-10-CM | POA: Diagnosis not present

## 2021-04-13 DIAGNOSIS — H2513 Age-related nuclear cataract, bilateral: Secondary | ICD-10-CM | POA: Diagnosis not present

## 2021-04-13 DIAGNOSIS — H3581 Retinal edema: Secondary | ICD-10-CM | POA: Diagnosis not present

## 2021-05-20 ENCOUNTER — Other Ambulatory Visit: Payer: Self-pay

## 2021-05-20 MED ORDER — REPATHA SURECLICK 140 MG/ML ~~LOC~~ SOAJ: 140.0000 mg | SUBCUTANEOUS | 0 refills | Status: DC

## 2021-06-02 ENCOUNTER — Encounter: Payer: Self-pay | Admitting: Psychiatry

## 2021-06-02 ENCOUNTER — Other Ambulatory Visit: Payer: Self-pay

## 2021-06-02 ENCOUNTER — Ambulatory Visit: Payer: PPO | Admitting: Psychiatry

## 2021-06-02 DIAGNOSIS — F5105 Insomnia due to other mental disorder: Secondary | ICD-10-CM | POA: Diagnosis not present

## 2021-06-02 DIAGNOSIS — F39 Unspecified mood [affective] disorder: Secondary | ICD-10-CM | POA: Diagnosis not present

## 2021-06-02 DIAGNOSIS — F418 Other specified anxiety disorders: Secondary | ICD-10-CM | POA: Diagnosis not present

## 2021-06-02 MED ORDER — ESZOPICLONE 3 MG PO TABS: 3.0000 mg | ORAL_TABLET | Freq: Every day | ORAL | 5 refills | Status: DC

## 2021-06-02 MED ORDER — OXCARBAZEPINE 150 MG PO TABS: ORAL_TABLET | ORAL | 2 refills | Status: DC

## 2021-06-02 NOTE — Progress Notes (Signed)
Cameron Stout ?[id number] ?1950/12/18 ?71 y.o. ? ?Subjective:  ? ?Patient ID:  Cameron Stout is a 71 y.o. (DOB 1951/01/11) male. ? ?Chief Complaint:  ?Chief Complaint  ?Patient presents with  ? Follow-up  ?  Episodic mood disorder (HCC)  ? ? ?HPI ?[name] presents to the office today for follow-up of episodic mood disorder. ? ?seen Sept 2020.  No meds were changed.  He remained on Trileptal 150 mg tablet one half every morning and 2 tablets every afternoon, Lunesta 2 mg nightly as needed insomnia ? ?05/24/19 appt with the following noted: ?Forcing to partner or join big group and it's stresful and trying to decide what to do.  Affects his sleep.  Parnter not handling it well.  Lunesta helps.   ?Disc retirement and need for something to do when he cuts back work.   He plans to gradually cut back and not stop. ?Overall OK.  During high stress March-May increased Trileptal 2 tab BID and then able to cut it back.  ?Plan: No med changes ? 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  Big decision.  Plans to only work 3 days/week.  Plans to continue that for 6 mos and reassess. ?Busy with side businesses rental houses.   ?Mood has been stable.  Stress can affect sleep and some irritability at times. Still rides motorcycle for fun.  No complaints with meds. ?Plan: no changes ? ?06/08/20 appt noted: ?Sold practice.  Still working on the process.  Some patients upset over his retirement.  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   Remodeling house.  Staying busy mostly.  Exercising and going to gym.  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   Taking bike trips now with a group.  Has some trips.  Mood is better now that transition is further down the road.  Mood is fine.  Tends to be emotional easily. ? ?12/09/20 appt noted: ?Slowed down in work but 3 days per week.  Took yearly  trip to [place].  D moving back from [place]. Going to [place].  Doing fine.  No complaints. ?Upcoming speaking  situations generating anxiety. ?Patient reports stable mood and denies depressed but mild manageable irritable moods.  Patient denies any recent difficulty with anxiety.  Patient denies difficulty with sleep initiation or maintenance. Rare Lunesta. Average 6 hours sleep is good.  Sleep is good generally unless occ pain. Denies appetite disturbance.  Patient reports that energy and motivation have been good.  Patient denies any difficulty with concentration. Patient denies any suicidal ideation. ?Plan: No med changes indicated .  Option increase Trileptal to 300 BID during the stress. ?Propranolol prn for perfoming anxiety ? ?06/02/2021 appt noted: ?Doing fine.  Slowing down at work is tricky and some days anxious delaing with it.  Up to him when to stop and plans to stop end of the year.   Can't be as generous as he used to be now that corporate take over.   ?Patient reports stable mood and denies depressed or irritable moods.   Patient denies difficulty with sleep initiation or maintenance. Denies appetite disturbance.  Patient reports that energy and motivation have been good.  Patient denies any difficulty with concentration.  Patient denies any suicidal ideation. ? ? ?Past Psychiatric Medication Trials: Lithium 750, Depakote, Equetro, Trileptal since 2015, Lunesta, propranolol, fluoxetine, sertraline, ProSom ?Under care of this practice since 2007 ? ?Review of Systems:  ?Review of Systems  ?Constitutional:  Negative for fatigue.  ?Cardiovascular:  Negative for palpitations.  ?Neurological:  Negative for dizziness, tremors and weakness.  ?Psychiatric/Behavioral:  The patient is nervous/anxious.   ? ?Medications: I have reviewed the patient's current medications. ? ?Current Outpatient Medications  ?Medication Sig Dispense Refill  ? aspirin EC 81 MG tablet Take 81 mg by mouth daily.    ? dexlansoprazole (DEXILANT) 60 MG capsule Take 60 mg by mouth daily.    ? Evolocumab (REPATHA SURECLICK) 140 MG/ML SOAJ Inject 140  mg into the skin every 14 (fourteen) days. 6 mL 0  ? ezetimibe (ZETIA) 10 MG tablet Take 10 mg by mouth every morning.    ? loratadine (CLARITIN) 10 MG tablet Take 10 mg by mouth daily.    ? propranolol (INDERAL) 20 MG tablet TAKE 1 TO 2 TABLETS BY MOUTH TWICE A DAY AS NEEDED FOR ANXIETY 360 tablet 1  ? Eszopiclone 3 MG TABS Take 1 tablet (3 mg total) by mouth at bedtime. Take immediately before bedtime 30 tablet 5  ? OXcarbazepine (TRILEPTAL) 150 MG tablet 1 twice daily and 2 tablets at night 360 tablet 2  ? ?No current facility-administered medications for this visit.  ? ? ?Medication Side Effects: None ? ?Allergies:  ?Allergies  ?Allergen Reactions  ? Atorvastatin Other (See Comments)  ?  He developed polymyalgia rheumatica was on long-term steroids for 5 years and could not take a statin again  ? ? ?Past Medical History:  ?Diagnosis Date  ? Bipolar disorder (HCC)   ? GERD (gastroesophageal reflux disease)   ? H/O polymyalgia rheumatica   ? Pneumonia   ? ? ?Family History  ?Problem Relation Age of Onset  ? Hypertension Mother   ? Obesity Mother   ? Heart disease Father   ? Heart attack Brother   ? Heart attack Brother   ? ? ?Social History  ? ?Socioeconomic History  ? Marital status: Married  ?  Spouse name: Not on file  ? Number of children: Not on file  ? Years of education: Not on file  ? Highest education level: Not on file  ?Occupational History  ? Not on file  ?Tobacco Use  ? Smoking status: Never  ? Smokeless tobacco: Never  ?Substance and Sexual Activity  ? Alcohol use: Yes  ?  Comment: occassionally  ? Drug use: No  ? Sexual activity: Not on file  ?Other Topics Concern  ? Not on file  ?Social History Narrative  ? Not on file  ? ?Social Determinants of Health  ? ?Financial Resource Strain: Not on file  ?Food Insecurity: Not on file  ?Transportation Needs: Not on file  ?Physical Activity: Not on file  ?Stress: Not on file  ?Social Connections: Not on file  ?Intimate Partner Violence: Not on file  ? ? ?Past  Medical History, Surgical history, Social history, and Family history were reviewed and updated as appropriate.  ? ?Please see review of systems for further details on the patient's review from today.  ? ?Objective:  ? ?Physical Exam:  ?There were no vitals taken for this visit. ? ?Physical Exam ?Constitutional:   ?   General: He is not in acute distress. ?   Appearance: He is well-developed.  ?Musculoskeletal:     ?   General: No deformity.  ?Neurological:  ?   Mental Status: He is alert and oriented to person, place, and time.  ?   Coordination: Coordination normal.  ?Psychiatric:     ?   Attention and Perception:  Attention and perception normal. He does not perceive auditory or visual hallucinations.     ?   Mood and Affect: Mood is anxious. Mood is not depressed. Affect is not labile, blunt, angry or inappropriate.     ?   Speech: Speech normal. Speech is not slurred.     ?   Behavior: Behavior normal. Behavior is not slowed.     ?   Thought Content: Thought content is not paranoid or delusional. Thought content does not include homicidal or suicidal ideation. Thought content does not include suicidal plan.     ?   Cognition and Memory: Cognition and memory normal.     ?   Judgment: Judgment normal.  ?   Comments: Insight intact  ? ? ?Lab Review:  ?   ?Component Value Date/Time  ? NA 140 11/02/2020 1105  ? K 4.4 11/02/2020 1105  ? CL 105 11/02/2020 1105  ? CO2 23 11/02/2020 1105  ? GLUCOSE 87 11/02/2020 1105  ? BUN 26 11/02/2020 1105  ? CREATININE 0.95 11/02/2020 1105  ? CALCIUM 9.3 11/02/2020 1105  ? PROT 6.8 06/17/2020 0943  ? ALBUMIN 4.7 06/17/2020 0943  ? AST 30 06/17/2020 0943  ? ALT 18 06/17/2020 0943  ? ALKPHOS 74 06/17/2020 0943  ? BILITOT 0.5 06/17/2020 0943  ? ? ?No results found for: WBC, RBC, HGB, HCT, PLT, MCV, MCH, MCHC, RDW, LYMPHSABS, MONOABS, EOSABS, BASOSABS ? ?No results found for: POCLITH, LITHIUM  ? ?No results found for: PHENYTOIN, PHENOBARB, VALPROATE, CBMZ  ? ?.res ?Assessment: Plan:    ? ?Ethelene Brownsnthony was seen today for follow-up. ? ?Diagnoses and all orders for this visit: ? ?Episodic mood disorder (HCC) ?-     OXcarbazepine (TRILEPTAL) 150 MG tablet; 1 twice daily and 2 tablets at nigh

## 2021-06-16 ENCOUNTER — Ambulatory Visit: Payer: PPO | Admitting: Psychiatry

## 2021-07-13 DIAGNOSIS — M7521 Bicipital tendinitis, right shoulder: Secondary | ICD-10-CM | POA: Diagnosis not present

## 2021-07-13 DIAGNOSIS — M7541 Impingement syndrome of right shoulder: Secondary | ICD-10-CM | POA: Diagnosis not present

## 2021-07-13 DIAGNOSIS — Z791 Long term (current) use of non-steroidal anti-inflammatories (NSAID): Secondary | ICD-10-CM | POA: Diagnosis not present

## 2021-07-15 DIAGNOSIS — M7541 Impingement syndrome of right shoulder: Secondary | ICD-10-CM

## 2021-07-15 HISTORY — DX: Impingement syndrome of right shoulder: M75.41

## 2021-07-31 DIAGNOSIS — J019 Acute sinusitis, unspecified: Secondary | ICD-10-CM | POA: Diagnosis not present

## 2021-07-31 DIAGNOSIS — B9689 Other specified bacterial agents as the cause of diseases classified elsewhere: Secondary | ICD-10-CM | POA: Diagnosis not present

## 2021-08-04 DIAGNOSIS — M7541 Impingement syndrome of right shoulder: Secondary | ICD-10-CM | POA: Diagnosis not present

## 2021-08-04 DIAGNOSIS — M7521 Bicipital tendinitis, right shoulder: Secondary | ICD-10-CM | POA: Diagnosis not present

## 2021-08-09 DIAGNOSIS — J0141 Acute recurrent pansinusitis: Secondary | ICD-10-CM | POA: Diagnosis not present

## 2021-08-12 DIAGNOSIS — R0981 Nasal congestion: Secondary | ICD-10-CM | POA: Diagnosis not present

## 2021-08-16 ENCOUNTER — Other Ambulatory Visit: Payer: Self-pay | Admitting: Cardiology

## 2021-08-19 DIAGNOSIS — M75121 Complete rotator cuff tear or rupture of right shoulder, not specified as traumatic: Secondary | ICD-10-CM | POA: Diagnosis not present

## 2021-08-19 DIAGNOSIS — M7521 Bicipital tendinitis, right shoulder: Secondary | ICD-10-CM | POA: Diagnosis not present

## 2021-08-19 DIAGNOSIS — M7541 Impingement syndrome of right shoulder: Secondary | ICD-10-CM | POA: Diagnosis not present

## 2021-08-23 DIAGNOSIS — M75121 Complete rotator cuff tear or rupture of right shoulder, not specified as traumatic: Secondary | ICD-10-CM | POA: Insufficient documentation

## 2021-08-23 HISTORY — DX: Complete rotator cuff tear or rupture of right shoulder, not specified as traumatic: M75.121

## 2021-08-24 DIAGNOSIS — M5136 Other intervertebral disc degeneration, lumbar region: Secondary | ICD-10-CM | POA: Diagnosis not present

## 2021-08-24 DIAGNOSIS — M461 Sacroiliitis, not elsewhere classified: Secondary | ICD-10-CM | POA: Diagnosis not present

## 2021-08-24 DIAGNOSIS — M47816 Spondylosis without myelopathy or radiculopathy, lumbar region: Secondary | ICD-10-CM | POA: Diagnosis not present

## 2021-08-24 DIAGNOSIS — M5137 Other intervertebral disc degeneration, lumbosacral region: Secondary | ICD-10-CM | POA: Diagnosis not present

## 2021-08-25 DIAGNOSIS — R5383 Other fatigue: Secondary | ICD-10-CM | POA: Diagnosis not present

## 2021-09-09 DIAGNOSIS — M47816 Spondylosis without myelopathy or radiculopathy, lumbar region: Secondary | ICD-10-CM | POA: Diagnosis not present

## 2021-09-09 DIAGNOSIS — M5137 Other intervertebral disc degeneration, lumbosacral region: Secondary | ICD-10-CM | POA: Diagnosis not present

## 2021-09-10 DIAGNOSIS — R519 Headache, unspecified: Secondary | ICD-10-CM | POA: Diagnosis not present

## 2021-09-10 DIAGNOSIS — R0981 Nasal congestion: Secondary | ICD-10-CM | POA: Diagnosis not present

## 2021-09-14 DIAGNOSIS — M47816 Spondylosis without myelopathy or radiculopathy, lumbar region: Secondary | ICD-10-CM | POA: Diagnosis not present

## 2021-09-14 DIAGNOSIS — M5137 Other intervertebral disc degeneration, lumbosacral region: Secondary | ICD-10-CM | POA: Diagnosis not present

## 2021-09-20 ENCOUNTER — Other Ambulatory Visit: Payer: Self-pay | Admitting: Family Medicine

## 2021-09-20 DIAGNOSIS — R935 Abnormal findings on diagnostic imaging of other abdominal regions, including retroperitoneum: Secondary | ICD-10-CM

## 2021-10-03 ENCOUNTER — Ambulatory Visit
Admission: RE | Admit: 2021-10-03 | Discharge: 2021-10-03 | Disposition: A | Discharge status: Home / Self Care | Payer: PPO | Source: Ambulatory Visit | Attending: Family Medicine | Admitting: Family Medicine

## 2021-10-03 DIAGNOSIS — R935 Abnormal findings on diagnostic imaging of other abdominal regions, including retroperitoneum: Secondary | ICD-10-CM

## 2021-10-03 DIAGNOSIS — N2889 Other specified disorders of kidney and ureter: Secondary | ICD-10-CM | POA: Diagnosis not present

## 2021-10-03 DIAGNOSIS — K8020 Calculus of gallbladder without cholecystitis without obstruction: Secondary | ICD-10-CM | POA: Diagnosis not present

## 2021-10-03 DIAGNOSIS — N281 Cyst of kidney, acquired: Secondary | ICD-10-CM | POA: Diagnosis not present

## 2021-10-03 MED ORDER — GADOBENATE DIMEGLUMINE 529 MG/ML IV SOLN
18.0000 mL | Freq: Once | INTRAVENOUS | Status: AC | PRN
  Administered 2021-10-03: 18 mL via INTRAVENOUS

## 2021-10-04 DIAGNOSIS — M5137 Other intervertebral disc degeneration, lumbosacral region: Secondary | ICD-10-CM | POA: Diagnosis not present

## 2021-10-04 DIAGNOSIS — M47816 Spondylosis without myelopathy or radiculopathy, lumbar region: Secondary | ICD-10-CM | POA: Diagnosis not present

## 2021-10-19 DIAGNOSIS — Z888 Allergy status to other drugs, medicaments and biological substances status: Secondary | ICD-10-CM | POA: Diagnosis not present

## 2021-10-19 DIAGNOSIS — M65342 Trigger finger, left ring finger: Secondary | ICD-10-CM

## 2021-10-19 HISTORY — DX: Trigger finger, left ring finger: M65.342

## 2021-11-20 ENCOUNTER — Other Ambulatory Visit: Payer: Self-pay | Admitting: Cardiology

## 2021-11-26 DIAGNOSIS — M5030 Other cervical disc degeneration, unspecified cervical region: Secondary | ICD-10-CM | POA: Diagnosis not present

## 2021-11-26 DIAGNOSIS — S43431A Superior glenoid labrum lesion of right shoulder, initial encounter: Secondary | ICD-10-CM | POA: Diagnosis not present

## 2021-11-26 DIAGNOSIS — Z7982 Long term (current) use of aspirin: Secondary | ICD-10-CM | POA: Diagnosis not present

## 2021-11-26 DIAGNOSIS — Z7952 Long term (current) use of systemic steroids: Secondary | ICD-10-CM | POA: Diagnosis not present

## 2021-11-26 DIAGNOSIS — M7541 Impingement syndrome of right shoulder: Secondary | ICD-10-CM | POA: Diagnosis not present

## 2021-11-26 DIAGNOSIS — M67911 Unspecified disorder of synovium and tendon, right shoulder: Secondary | ICD-10-CM | POA: Diagnosis not present

## 2021-11-26 DIAGNOSIS — E785 Hyperlipidemia, unspecified: Secondary | ICD-10-CM | POA: Diagnosis not present

## 2021-11-26 DIAGNOSIS — Z79899 Other long term (current) drug therapy: Secondary | ICD-10-CM | POA: Diagnosis not present

## 2021-11-26 DIAGNOSIS — M5136 Other intervertebral disc degeneration, lumbar region: Secondary | ICD-10-CM | POA: Diagnosis not present

## 2021-11-26 DIAGNOSIS — M7521 Bicipital tendinitis, right shoulder: Secondary | ICD-10-CM | POA: Diagnosis not present

## 2021-11-26 DIAGNOSIS — G8918 Other acute postprocedural pain: Secondary | ICD-10-CM | POA: Diagnosis not present

## 2021-11-26 DIAGNOSIS — K219 Gastro-esophageal reflux disease without esophagitis: Secondary | ICD-10-CM | POA: Diagnosis not present

## 2021-11-26 DIAGNOSIS — L409 Psoriasis, unspecified: Secondary | ICD-10-CM | POA: Diagnosis not present

## 2021-11-26 DIAGNOSIS — I7121 Aneurysm of the ascending aorta, without rupture: Secondary | ICD-10-CM | POA: Diagnosis not present

## 2021-11-26 DIAGNOSIS — M25811 Other specified joint disorders, right shoulder: Secondary | ICD-10-CM | POA: Diagnosis not present

## 2021-11-26 DIAGNOSIS — M353 Polymyalgia rheumatica: Secondary | ICD-10-CM | POA: Diagnosis not present

## 2021-11-26 DIAGNOSIS — M75121 Complete rotator cuff tear or rupture of right shoulder, not specified as traumatic: Secondary | ICD-10-CM | POA: Diagnosis not present

## 2021-11-28 NOTE — Progress Notes (Unsigned)
Cardiology Office Note:    Date:  11/29/2021   ID:  Cameron Stout, DOB 01/14/1951, MRN [id number]  PCP:  Lupita Raider, MD  Cardiologist:  Norman Herrlich, MD    Referring MD: Lupita Raider, MD    ASSESSMENT:    1. Aneurysm of ascending aorta without rupture (HCC)   2. Hyperlipidemia LDL goal <70   3. Agatston coronary artery calcium score less than 100    PLAN:    In order of problems listed above:  Last CT scan was just shy of criteria for aneurysm ascending aorta 44 mm he is on good therapy for his hypertension including beta-blocker and we will go ahead and recheck a CTA 2 months.  If stable and we can wait 18 months for his next image On good lipid-lowering therapy continue his current combination   Next appointment: 1 year he will see me in the [place] office   Medication Adjustments/Labs and Tests Ordered: Current medicines are reviewed at length with the patient today.  Concerns regarding medicines are outlined above.  No orders of the defined types were placed in this encounter.  No orders of the defined types were placed in this encounter.   Follow-up for hyperlipidemia and enlargement ascending aorta   History of Present Illness:    Cameron Stout is a 71 y.o. male with a hx of coronary atherosclerosis with a calcium score of 70/45th percentile and fusiform enlargement ascending aorta 45 mm stable on follow-up CT scan 11/06/2020 at 44 mm last seen 11/18/2020.  Compliance with diet, lifestyle and medications: Yes  He had recent uncomplicated right shoulder surgery rotator cuff and bicep tendon repair He has had no chest pain edema shortness of breath palpitation or syncope Tolerates his lipid-lowering with PCSK9 inhibitor and Zetia and his last LDL a year ago was 50 and he has upcoming labs with his primary care physician With plan to do a follow-up CT to look at his ascending aorta and will wait 2 months to recover from shoulder surgery On the  blood pressure is controlled less than 130/70 Past Medical History:  Diagnosis Date   Bipolar disorder (HCC)    GERD (gastroesophageal reflux disease)    H/O polymyalgia rheumatica    Pneumonia     Past Surgical History:  Procedure Laterality Date   back injections     in S 1 joint   COLONOSCOPY WITH PROPOFOL N/A 06/23/2014   Procedure: COLONOSCOPY WITH PROPOFOL;  Surgeon: Charolett Bumpers, MD;  Location: [facility];  Service: Endoscopy;  Laterality: N/A;   HERNIA REPAIR     inguinal   KNEE ARTHROSCOPY      Current Medications: Current Meds  Medication Sig   aspirin EC 81 MG tablet Take 81 mg by mouth daily.   dexlansoprazole (DEXILANT) 60 MG capsule Take 60 mg by mouth daily.   Eszopiclone 3 MG TABS Take 1 tablet (3 mg total) by mouth at bedtime. Take immediately before bedtime   Evolocumab (REPATHA SURECLICK) 140 MG/ML SOAJ INJECT 140 MG INTO THE SKIN EVERY 14 (FOURTEEN) DAYS.   ezetimibe (ZETIA) 10 MG tablet Take 10 mg by mouth every morning.   loratadine (CLARITIN) 10 MG tablet Take 10 mg by mouth daily.   OXcarbazepine (TRILEPTAL) 150 MG tablet 1 twice daily and 2 tablets at night   propranolol (INDERAL) 20 MG tablet TAKE 1 TO 2 TABLETS BY MOUTH TWICE A DAY AS NEEDED FOR ANXIETY     Allergies:   Atorvastatin  Social History   Socioeconomic History   Marital status: Married    Spouse name: Not on file   Number of children: Not on file   Years of education: Not on file   Highest education level: Not on file  Occupational History   Not on file  Tobacco Use   Smoking status: Never   Smokeless tobacco: Never  Substance and Sexual Activity   Alcohol use: Yes    Comment: occassionally   Drug use: No   Sexual activity: Not on file  Other Topics Concern   Not on file  Social History Narrative   Not on file   Social Determinants of Health   Financial Resource Strain: Not on file  Food Insecurity: Not on file  Transportation Needs: Not on file  Physical  Activity: Not on file  Stress: Not on file  Social Connections: Not on file     Family History: The patient's family history includes Heart attack in his brother and brother; Heart disease in his father; Hypertension in his mother; Obesity in his mother. ROS:   Please see the history of present illness.    All other systems reviewed and are negative.  EKGs/Labs/Other Studies Reviewed:    The following studies were reviewed today:  EKG:  EKG ordered today and personally reviewed.  The ekg ordered today demonstrates sinus bradycardia first-degree AV block otherwise normal EKG  Recent Labs: No results found for requested labs within last 365 days.  Recent Lipid Panel    Component Value Date/Time   CHOL 142 06/17/2020 0943   TRIG 61 06/17/2020 0943   HDL 72 06/17/2020 0943   CHOLHDL 2.0 06/17/2020 0943   LDLCALC 57 06/17/2020 0943    Physical Exam:    VS:  BP (!) 142/92 (BP Location: Left Arm, Patient Position: Sitting)   Pulse (!) 50   Ht 5\' 10"  (1.778 m)   Wt 197 lb (89.4 kg)   SpO2 98%   BMI 28.27 kg/m     Wt Readings from Last 3 Encounters:  11/29/21 197 lb (89.4 kg)  02/21/21 195 lb (88.5 kg)  11/18/20 206 lb (93.4 kg)     GEN:  Well nourished, well developed in no acute distress HEENT: Normal NECK: No JVD; No carotid bruits LYMPHATICS: No lymphadenopathy CARDIAC: RRR, no murmurs, rubs, gallops RESPIRATORY:  Clear to auscultation without rales, wheezing or rhonchi  ABDOMEN: Soft, non-tender, non-distended MUSCULOSKELETAL:  No edema; No deformity  SKIN: Warm and dry NEUROLOGIC:  Alert and oriented x 3 PSYCHIATRIC:  Normal affect    Signed, 11/20/20, MD  11/29/2021 9:25 AM    [facility]

## 2021-11-29 ENCOUNTER — Ambulatory Visit: Payer: PPO | Admitting: Psychiatry

## 2021-11-29 ENCOUNTER — Ambulatory Visit: Payer: PPO | Attending: Cardiology | Admitting: Cardiology

## 2021-11-29 ENCOUNTER — Encounter: Payer: Self-pay | Admitting: Cardiology

## 2021-11-29 VITALS — BP 142/92 | HR 50 | Ht 70.0 in | Wt 197.0 lb

## 2021-11-29 DIAGNOSIS — I7121 Aneurysm of the ascending aorta, without rupture: Secondary | ICD-10-CM

## 2021-11-29 DIAGNOSIS — E785 Hyperlipidemia, unspecified: Secondary | ICD-10-CM

## 2021-11-29 DIAGNOSIS — R931 Abnormal findings on diagnostic imaging of heart and coronary circulation: Secondary | ICD-10-CM

## 2021-11-29 DIAGNOSIS — R072 Precordial pain: Secondary | ICD-10-CM | POA: Diagnosis not present

## 2021-11-29 NOTE — Addendum Note (Signed)
Addended by: Roxanne Mins I on: 11/29/2021 10:11 AM   Modules accepted: Orders

## 2021-11-29 NOTE — Patient Instructions (Signed)
Medication Instructions:  Your physician recommends that you continue on your current medications as directed. Please refer to the Current Medication list given to you today.  *If you need a refill on your cardiac medications before your next appointment, please call your pharmacy*   Lab Work: Your physician recommends that you return for lab work in:   Labs 1 week before CT: BMP  If you have labs (blood work) drawn today and your tests are completely normal, you will receive your results only by: MyChart Message (if you have MyChart) OR A paper copy in the mail If you have any lab test that is abnormal or we need to change your treatment, we will call you to review the results.   Testing/Procedures:   Your cardiac CT will be scheduled at one of the below locations:   [facility] [address] [place], [place] [address] [phone]  OR  [facility] [address] [place], [place] [address] [phone]  OR   [facility] [address] [place], [place] [address] [phone]  If scheduled at [facility], please arrive at the [facility] En[facility]ance (Entrance C2) of [facility] 30 minutes prior to test start time. You can use the FREE valet parking offered at entrance C (encouraged to control the heart rate for the test)  Proceed to the [facility] Radiology Department (first floor) to check-in and test prep.  All radiology patients and guests should use entrance C2 at [facility], accessed from [facility], even though the hospital's physical address listed is [address]    If scheduled at [facility] or [facility], please arrive 15 mins early for check-in and test prep.   Please follow these instructions carefully (unless otherwise directed):  On the Night Before the Test: Be  sure to Drink plenty of water. Do not consume any caffeinated/decaffeinated beverages or chocolate 12 hours prior to your test. Do not take any antihistamines 12 hours prior to your test.  On the Day of the Test: Drink plenty of water until 1 hour prior to the test. You may take your regular medications prior to the test.       After the Test: Drink plenty of water. After receiving IV contrast, you may experience a mild flushed feeling. This is normal. On occasion, you may experience a mild rash up to 24 hours after the test. This is not dangerous. If this occurs, you can take Benadryl 25 mg and increase your fluid intake. If you experience trouble breathing, this can be serious. If it is severe call 911 IMMEDIATELY. If it is mild, please call our office. If you take any of these medications: Glipizide/Metformin, Avandament, Glucavance, please do not take 48 hours after completing test unless otherwise instructed.  We will call to schedule your test 2-4 weeks out understanding that some insurance companies will need an authorization prior to the service being performed.   For non-scheduling related questions, please contact the cardiac imaging nurse navigator should you have any questions/concerns: Rockwell Alexandria, Cardiac Imaging Nurse Navigator Larey Brick, Cardiac Imaging Nurse Navigator [facility] Direct Office Dial: [phone]   For scheduling needs, including cancellations and rescheduling, please call [place], [phone].    Follow-Up: At [facility], you and your health needs are our priority.  As part of our continuing mission to provide you with  exceptional heart care, we have created designated Provider Care Teams.  These Care Teams include your primary Cardiologist (physician) and Advanced Practice Providers (APPs -  Physician Assistants and Nurse Practitioners) who all work together to provide you with the care you need, when you  need it.  We recommend signing up for the patient portal called "MyChart".  Sign up information is provided on this After Visit Summary.  MyChart is used to connect with patients for Virtual Visits (Telemedicine).  Patients are able to view lab/test results, encounter notes, upcoming appointments, etc.  Non-urgent messages can be sent to your provider as well.   To learn more about what you can do with MyChart, go to [email or web address].    Your next appointment:   1 year(s)  The format for your next appointment:   In Person  Provider:   Norman Herrlich, MD    Other Instructions None  Important Information About Sugar

## 2021-12-10 DIAGNOSIS — Z4789 Encounter for other orthopedic aftercare: Secondary | ICD-10-CM | POA: Diagnosis not present

## 2021-12-26 ENCOUNTER — Other Ambulatory Visit: Payer: Self-pay | Admitting: Cardiology

## 2021-12-31 DIAGNOSIS — Z9889 Other specified postprocedural states: Secondary | ICD-10-CM | POA: Diagnosis not present

## 2021-12-31 DIAGNOSIS — Z789 Other specified health status: Secondary | ICD-10-CM | POA: Diagnosis not present

## 2021-12-31 DIAGNOSIS — M7521 Bicipital tendinitis, right shoulder: Secondary | ICD-10-CM | POA: Diagnosis not present

## 2021-12-31 DIAGNOSIS — R29898 Other symptoms and signs involving the musculoskeletal system: Secondary | ICD-10-CM | POA: Diagnosis not present

## 2021-12-31 DIAGNOSIS — M25611 Stiffness of right shoulder, not elsewhere classified: Secondary | ICD-10-CM | POA: Diagnosis not present

## 2021-12-31 DIAGNOSIS — Z7409 Other reduced mobility: Secondary | ICD-10-CM | POA: Diagnosis not present

## 2022-01-11 DIAGNOSIS — Z9889 Other specified postprocedural states: Secondary | ICD-10-CM

## 2022-01-11 DIAGNOSIS — M75121 Complete rotator cuff tear or rupture of right shoulder, not specified as traumatic: Secondary | ICD-10-CM | POA: Diagnosis not present

## 2022-01-11 DIAGNOSIS — M25611 Stiffness of right shoulder, not elsewhere classified: Secondary | ICD-10-CM | POA: Diagnosis not present

## 2022-01-11 DIAGNOSIS — M7541 Impingement syndrome of right shoulder: Secondary | ICD-10-CM | POA: Diagnosis not present

## 2022-01-11 DIAGNOSIS — Z7409 Other reduced mobility: Secondary | ICD-10-CM | POA: Diagnosis not present

## 2022-01-11 DIAGNOSIS — Z4789 Encounter for other orthopedic aftercare: Secondary | ICD-10-CM | POA: Diagnosis not present

## 2022-01-11 DIAGNOSIS — M7521 Bicipital tendinitis, right shoulder: Secondary | ICD-10-CM | POA: Diagnosis not present

## 2022-01-11 HISTORY — DX: Other specified postprocedural states: Z98.890

## 2022-01-12 ENCOUNTER — Telehealth: Payer: Self-pay | Admitting: Cardiology

## 2022-01-12 NOTE — Telephone Encounter (Signed)
BMP released so pt can get labs drawn in [place]. Pt aware.

## 2022-01-12 NOTE — Telephone Encounter (Signed)
Advised that the order is released and [facility] should not have problems seeing the order. Pt verbalized understanding.

## 2022-01-12 NOTE — Telephone Encounter (Signed)
Patient is calling stating his physical with his PCP was rescheduled where he planned to get his labs drawn that were needed for his upcoming CT. Due to this he is requesting his lab orders be released to [place] so he is able to have them drawn in [place] tomorrow at the [facility] [employer] next to his job. Please advise.

## 2022-01-13 DIAGNOSIS — R072 Precordial pain: Secondary | ICD-10-CM | POA: Diagnosis not present

## 2022-01-13 DIAGNOSIS — E785 Hyperlipidemia, unspecified: Secondary | ICD-10-CM | POA: Diagnosis not present

## 2022-01-13 DIAGNOSIS — I7121 Aneurysm of the ascending aorta, without rupture: Secondary | ICD-10-CM | POA: Diagnosis not present

## 2022-01-13 DIAGNOSIS — R931 Abnormal findings on diagnostic imaging of heart and coronary circulation: Secondary | ICD-10-CM | POA: Diagnosis not present

## 2022-01-14 ENCOUNTER — Telehealth (HOSPITAL_COMMUNITY): Payer: Self-pay | Admitting: Emergency Medicine

## 2022-01-14 ENCOUNTER — Other Ambulatory Visit (HOSPITAL_COMMUNITY): Payer: Self-pay | Admitting: Cardiology

## 2022-01-14 DIAGNOSIS — I7121 Aneurysm of the ascending aorta, without rupture: Secondary | ICD-10-CM

## 2022-01-14 LAB — BASIC METABOLIC PANEL
BUN/Creatinine Ratio: 18 (ref 10–24)
BUN: 19 mg/dL (ref 8–27)
CO2: 21 mmol/L (ref 20–29)
Calcium: 10.1 mg/dL (ref 8.6–10.2)
Chloride: 98 mmol/L (ref 96–106)
Creatinine, Ser: 1.03 mg/dL (ref 0.76–1.27)
Glucose: 91 mg/dL (ref 70–99)
Potassium: 5 mmol/L (ref 3.5–5.2)
Sodium: 136 mmol/L (ref 134–144)
eGFR: 79 mL/min/{1.73_m2} (ref >59)

## 2022-01-14 NOTE — Telephone Encounter (Signed)
Reaching out to patient to offer assistance regarding upcoming cardiac imaging study; pt verbalizes understanding of appt date/time, parking situation and where to check in, pre-test NPO status and medications ordered, and verified current allergies; name and call back number provided for further questions should they arise Marchia Bond RN Navigator Cardiac Imaging [facility] [phone] office [phone] cell  Arrival [address] entrance Denies iv issues Aware nitro

## 2022-01-17 ENCOUNTER — Encounter: Payer: Self-pay | Admitting: Psychiatry

## 2022-01-17 ENCOUNTER — Ambulatory Visit: Payer: PPO | Admitting: Psychiatry

## 2022-01-17 ENCOUNTER — Ambulatory Visit (HOSPITAL_COMMUNITY)
Admission: RE | Admit: 2022-01-17 | Discharge: 2022-01-17 | Disposition: A | Discharge status: Home / Self Care | Payer: PPO | Source: Ambulatory Visit | Attending: Cardiology | Admitting: Cardiology

## 2022-01-17 DIAGNOSIS — F5105 Insomnia due to other mental disorder: Secondary | ICD-10-CM | POA: Diagnosis not present

## 2022-01-17 DIAGNOSIS — I7121 Aneurysm of the ascending aorta, without rupture: Secondary | ICD-10-CM | POA: Insufficient documentation

## 2022-01-17 DIAGNOSIS — R079 Chest pain, unspecified: Secondary | ICD-10-CM | POA: Diagnosis not present

## 2022-01-17 DIAGNOSIS — F418 Other specified anxiety disorders: Secondary | ICD-10-CM | POA: Diagnosis not present

## 2022-01-17 DIAGNOSIS — R072 Precordial pain: Secondary | ICD-10-CM | POA: Insufficient documentation

## 2022-01-17 DIAGNOSIS — F39 Unspecified mood [affective] disorder: Secondary | ICD-10-CM

## 2022-01-17 MED ORDER — IOHEXOL 350 MG/ML SOLN
95.0000 mL | Freq: Once | INTRAVENOUS | Status: AC | PRN
  Administered 2022-01-17: 95 mL via INTRAVENOUS

## 2022-01-17 MED ORDER — OXCARBAZEPINE 150 MG PO TABS: ORAL_TABLET | ORAL | 2 refills | Status: DC

## 2022-01-17 MED ORDER — NITROGLYCERIN 0.4 MG SL SUBL
SUBLINGUAL_TABLET | SUBLINGUAL | Status: AC
  Filled 2022-01-17: qty 2

## 2022-01-17 MED ORDER — PROPRANOLOL HCL 20 MG PO TABS: ORAL_TABLET | ORAL | 1 refills | Status: AC

## 2022-01-17 MED ORDER — NITROGLYCERIN 0.4 MG SL SUBL
0.8000 mg | SUBLINGUAL_TABLET | Freq: Once | SUBLINGUAL | Status: AC
  Administered 2022-01-17: 0.8 mg via SUBLINGUAL

## 2022-01-17 MED ORDER — ESZOPICLONE 3 MG PO TABS: 3.0000 mg | ORAL_TABLET | Freq: Every day | ORAL | 5 refills | Status: DC

## 2022-01-17 NOTE — Progress Notes (Signed)
Cameron Stout [id number] 07/02/1950 71 y.o.  Subjective:   Patient ID:  Cameron Stout is a 71 y.o. (DOB 1950/09/22) male.  Chief Complaint:  Chief Complaint  Patient presents with   Follow-up    Episodic mood disorder [facility])    HPI Cameron Stout presents to the office today for follow-up of episodic mood disorder.  seen Sept 2020.  No meds were changed.  He remained on Trileptal 150 mg tablet one half every morning and 2 tablets every afternoon, Lunesta 2 mg nightly as needed insomnia  05/24/19 appt with the following noted: Forcing to partner or join big group and it's stresful and trying to decide what to do.  Affects his sleep.  Parnter not handling it well.  Lunesta helps.   Disc retirement and need for something to do when he cuts back work.   He plans to gradually cut back and not stop. Overall OK.  During high stress March-May increased Trileptal 2 tab BID and then able to cut it back.  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  Big decision.  Plans to only work 3 days/week.  Plans to continue that for 6 mos and reassess. Busy with side businesses rental houses.   Mood has been stable.  Stress can affect sleep and some irritability at times. Still rides motorcycle for fun.  No complaints with meds. 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  Some patients upset over his retirement.  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   Remodeling house.  Staying busy mostly.  Exercising and going to gym.  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   Taking bike trips now with a group.  Has some trips.  Mood is better now that transition is further down the road.  Mood is fine.  Tends to be emotional easily.  12/09/20 appt noted: Slowed down in work but 3 days per week.  Took yearly  trip to [place].  D moving back from [place]. Going to [place].  Doing fine.  No complaints. Upcoming speaking  situations generating anxiety. Patient reports stable mood and denies depressed but mild manageable irritable moods.  Patient denies any recent difficulty with anxiety.  Patient denies difficulty with sleep initiation or maintenance. Rare Lunesta. Average 6 hours sleep is good.  Sleep is good generally unless occ pain. Denies appetite disturbance.  Patient reports that energy and motivation have been good.  Patient denies any difficulty with concentration. Patient denies any suicidal ideation. Plan: No med changes indicated .  Option increase Trileptal to 300 BID during the stress. Propranolol prn for perfoming anxiety  06/02/2021 appt noted: Doing fine.  Slowing down at work is tricky and some days anxious delaing with it.  Up to him when to stop and plans to stop end of the year.   Can't be as generous as he used to be now that corporate take over.   Patient reports stable mood and denies depressed or irritable moods.   Patient denies difficulty with sleep initiation or maintenance. Denies appetite disturbance.  Patient reports that energy and motivation have been good.  Patient denies any difficulty with concentration.  Patient denies any suicidal ideation. Plan no changes  01/17/22 appt noted: Good overall.  Still working on retirement plans. Shoulder surgery several weeks ago. Not ready to retire yet.  Scared to not be busy enough.   Still  issues with sleep bc hard to sleep with shoulder surgery.   Getting follow up of abd aorta.  Can still walk several miles. Was in the gym before shoulder surgery. Mood has been good.  Anxiety got married and he used propranolol for public speaking.  Got through the wedding fine.  D got married.     Past Psychiatric Medication Trials: Lithium 750, Depakote, Equetro, Trileptal since 2015, Lunesta, propranolol, fluoxetine, sertraline, ProSom Under care of this practice since 2007  Review of Systems:  Review of Systems  Constitutional:  Negative for  fatigue.  Cardiovascular:  Negative for palpitations.  Musculoskeletal:  Positive for arthralgias.  Neurological:  Negative for dizziness, tremors and weakness.  Psychiatric/Behavioral:  The patient is not nervous/anxious.     Medications: I have reviewed the patient's current medications.  Current Outpatient Medications  Medication Sig Dispense Refill   aspirin EC 81 MG tablet Take 81 mg by mouth daily.     dexlansoprazole (DEXILANT) 60 MG capsule Take 60 mg by mouth daily.     Evolocumab (REPATHA SURECLICK) 188 MG/ML SOAJ INJECT 140 MG INTO THE SKIN EVERY 14 (FOURTEEN) DAYS. 2 mL 0   ezetimibe (ZETIA) 10 MG tablet Take 10 mg by mouth every morning.     loratadine (CLARITIN) 10 MG tablet Take 10 mg by mouth daily.     Eszopiclone 3 MG TABS Take 1 tablet (3 mg total) by mouth at bedtime. Take immediately before bedtime 30 tablet 5   OXcarbazepine (TRILEPTAL) 150 MG tablet 1 twice daily and 2 tablets at night 360 tablet 2   propranolol (INDERAL) 20 MG tablet TAKE 1 TO 2 TABLETS BY MOUTH TWICE A DAY AS NEEDED FOR ANXIETY 360 tablet 1   No current facility-administered medications for this visit.    Medication Side Effects: None  Allergies:  Allergies  Allergen Reactions   Atorvastatin Other (See Comments)    He developed polymyalgia rheumatica was on long-term steroids for 5 years and could not take a statin again    Past Medical History:  Diagnosis Date   Bipolar disorder (HCC)    GERD (gastroesophageal reflux disease)    H/O polymyalgia rheumatica    Pneumonia     Family History  Problem Relation Age of Onset   Hypertension Mother    Obesity Mother    Heart disease Father    Heart attack Brother    Heart attack Brother     Social History   Socioeconomic History   Marital status: Married    Spouse name: Not on file   Number of children: Not on file   Years of education: Not on file   Highest education level: Not on file  Occupational History   Not on file   Tobacco Use   Smoking status: Never   Smokeless tobacco: Never  Substance and Sexual Activity   Alcohol use: Yes    Comment: occassionally   Drug use: No   Sexual activity: Not on file  Other Topics Concern   Not on file  Social History Narrative   Not on file   Social Determinants of Health   Financial Resource Strain: Not on file  Food Insecurity: Not on file  Transportation Needs: Not on file  Physical Activity: Not on file  Stress: Not on file  Social Connections: Not on file  Intimate Partner Violence: Not on file    Past Medical History, Surgical history, Social history, and Family history were reviewed and updated as appropriate.  Please see review of systems for further details on the patient's review from today.   Objective:   Physical Exam:  There were no vitals taken for this visit.  Physical Exam Constitutional:      General: He is not in acute distress.    Appearance: He is well-developed.  Musculoskeletal:        General: No deformity.  Neurological:     Mental Status: He is alert and oriented to person, place, and time.     Coordination: Coordination normal.  Psychiatric:        Attention and Perception: Attention and perception normal. He does not perceive auditory or visual hallucinations.        Mood and Affect: Mood is anxious. Mood is not depressed. Affect is not labile, blunt, angry or inappropriate.        Speech: Speech normal. Speech is not slurred.        Behavior: Behavior normal. Behavior is not slowed.        Thought Content: Thought content is not paranoid or delusional. Thought content does not include homicidal or suicidal ideation. Thought content does not include suicidal plan.        Cognition and Memory: Cognition and memory normal.        Judgment: Judgment normal.     Comments: Insight intact     Lab Review:     Component Value Date/Time   NA 136 01/13/2022 1152   K 5.0 01/13/2022 1152   CL 98 01/13/2022 1152   CO2 21  01/13/2022 1152   GLUCOSE 91 01/13/2022 1152   BUN 19 01/13/2022 1152   CREATININE 1.03 01/13/2022 1152   CALCIUM 10.1 01/13/2022 1152   PROT 6.8 06/17/2020 0943   ALBUMIN 4.7 06/17/2020 0943   AST 30 06/17/2020 0943   ALT 18 06/17/2020 0943   ALKPHOS 74 06/17/2020 0943   BILITOT 0.5 06/17/2020 0943    No results found for: "WBC", "RBC", "HGB", "HCT", "PLT", "MCV", "MCH", "MCHC", "RDW", "LYMPHSABS", "MONOABS", "EOSABS", "BASOSABS"  No results found for: "POCLITH", "LITHIUM"   No results found for: "PHENYTOIN", "PHENOBARB", "VALPROATE", "CBMZ"   .res Assessment: Plan:    Tyree was seen today for follow-up.  Diagnoses and all orders for this visit:  Episodic mood disorder (HCC) -     OXcarbazepine (TRILEPTAL) 150 MG tablet; 1 twice daily and 2 tablets at night  Performance anxiety -     propranolol (INDERAL) 20 MG tablet; TAKE 1 TO 2 TABLETS BY MOUTH TWICE A DAY AS NEEDED FOR ANXIETY  Insomnia due to mental condition -     Eszopiclone 3 MG TABS; Take 1 tablet (3 mg total) by mouth at bedtime. Take immediately before bedtime    Dx questions chronically between episodic mood disorder, bipolar type II, or atypical depression.  As noted he is tried several different medications and has responded best to Trileptal.  His primary symptom has been irritability and a history of anger outbursts that affected him at both work and home.  These have generally been managed well with the Trileptal.  He has been on Trileptal since 2015.  He is tolerating it well.  He had a bout with probably myalgia rheumatica which lasted for 3 years.  He is resolved he has been able to get off prednisone.  An autoimmune diet by Dr. Phillis Haggis has been very helpful.  He is exercising and that helps both his mood and his physical conditioning. Kept off 25 # he lost  a couple years ago.  Generally sleeping well without insists Lunesta but as needed on occasion.  Disc dependence and tolerance and how to do it.     Discussed his gradual slow transition into retirement and the need to develop hobbies for mental health reasons.  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 Supportive therapy on dealing with this. Dealing with transition into retirement.  No med changes indicated .  Option increase Trileptal to 300 BID during the stress.  Propranolol prn for perfoming anxiety  6-9 mos  Meredith Staggers, MD, DFAPA  Please see After Visit Summary for patient specific instructions.  Future Appointments  Date Time Provider Department Center  01/17/2022  1:30 PM [facility] 4 [facility]     No orders of the defined types were placed in this encounter.   -------------------------------

## 2022-01-18 ENCOUNTER — Telehealth: Payer: Self-pay

## 2022-01-18 NOTE — Telephone Encounter (Signed)
-----   Message from Richardo Priest, MD sent at 01/18/2022  9:36 AM EDT ----- This is a good stable result no difference in size between this and the previous 45 versus 44 mm.  Continue same medications

## 2022-01-18 NOTE — Telephone Encounter (Signed)
Patient notified of results.

## 2022-01-19 ENCOUNTER — Other Ambulatory Visit: Payer: Self-pay | Admitting: Cardiology

## 2022-01-20 DIAGNOSIS — Z789 Other specified health status: Secondary | ICD-10-CM | POA: Diagnosis not present

## 2022-01-20 DIAGNOSIS — M25611 Stiffness of right shoulder, not elsewhere classified: Secondary | ICD-10-CM | POA: Diagnosis not present

## 2022-01-20 DIAGNOSIS — R29898 Other symptoms and signs involving the musculoskeletal system: Secondary | ICD-10-CM | POA: Diagnosis not present

## 2022-01-20 DIAGNOSIS — Z9889 Other specified postprocedural states: Secondary | ICD-10-CM | POA: Diagnosis not present

## 2022-01-20 DIAGNOSIS — M7521 Bicipital tendinitis, right shoulder: Secondary | ICD-10-CM | POA: Diagnosis not present

## 2022-01-20 DIAGNOSIS — Z7409 Other reduced mobility: Secondary | ICD-10-CM | POA: Diagnosis not present

## 2022-01-26 DIAGNOSIS — L720 Epidermal cyst: Secondary | ICD-10-CM | POA: Diagnosis not present

## 2022-01-26 DIAGNOSIS — L814 Other melanin hyperpigmentation: Secondary | ICD-10-CM | POA: Diagnosis not present

## 2022-01-26 DIAGNOSIS — E663 Overweight: Secondary | ICD-10-CM | POA: Diagnosis not present

## 2022-01-26 DIAGNOSIS — X32XXXS Exposure to sunlight, sequela: Secondary | ICD-10-CM | POA: Diagnosis not present

## 2022-01-26 DIAGNOSIS — L821 Other seborrheic keratosis: Secondary | ICD-10-CM | POA: Diagnosis not present

## 2022-01-26 DIAGNOSIS — D1801 Hemangioma of skin and subcutaneous tissue: Secondary | ICD-10-CM | POA: Diagnosis not present

## 2022-01-29 DIAGNOSIS — J0141 Acute recurrent pansinusitis: Secondary | ICD-10-CM | POA: Diagnosis not present

## 2022-01-29 DIAGNOSIS — H66001 Acute suppurative otitis media without spontaneous rupture of ear drum, right ear: Secondary | ICD-10-CM | POA: Diagnosis not present

## 2022-02-09 DIAGNOSIS — H6691 Otitis media, unspecified, right ear: Secondary | ICD-10-CM | POA: Diagnosis not present

## 2022-02-09 DIAGNOSIS — H6121 Impacted cerumen, right ear: Secondary | ICD-10-CM | POA: Diagnosis not present

## 2022-02-15 DIAGNOSIS — I2510 Atherosclerotic heart disease of native coronary artery without angina pectoris: Secondary | ICD-10-CM | POA: Diagnosis not present

## 2022-02-15 DIAGNOSIS — M353 Polymyalgia rheumatica: Secondary | ICD-10-CM | POA: Diagnosis not present

## 2022-02-15 DIAGNOSIS — Z125 Encounter for screening for malignant neoplasm of prostate: Secondary | ICD-10-CM | POA: Diagnosis not present

## 2022-02-15 DIAGNOSIS — M8580 Other specified disorders of bone density and structure, unspecified site: Secondary | ICD-10-CM | POA: Diagnosis not present

## 2022-02-15 DIAGNOSIS — E782 Mixed hyperlipidemia: Secondary | ICD-10-CM | POA: Diagnosis not present

## 2022-02-15 DIAGNOSIS — Z0000 Encounter for general adult medical examination without abnormal findings: Secondary | ICD-10-CM | POA: Diagnosis not present

## 2022-02-15 DIAGNOSIS — Z23 Encounter for immunization: Secondary | ICD-10-CM | POA: Diagnosis not present

## 2022-02-15 DIAGNOSIS — I712 Thoracic aortic aneurysm, without rupture, unspecified: Secondary | ICD-10-CM | POA: Diagnosis not present

## 2022-02-15 DIAGNOSIS — N281 Cyst of kidney, acquired: Secondary | ICD-10-CM | POA: Diagnosis not present

## 2022-02-15 DIAGNOSIS — K219 Gastro-esophageal reflux disease without esophagitis: Secondary | ICD-10-CM | POA: Diagnosis not present

## 2022-02-15 DIAGNOSIS — F319 Bipolar disorder, unspecified: Secondary | ICD-10-CM | POA: Diagnosis not present

## 2022-02-16 ENCOUNTER — Other Ambulatory Visit: Payer: Self-pay | Admitting: Family Medicine

## 2022-02-16 DIAGNOSIS — N281 Cyst of kidney, acquired: Secondary | ICD-10-CM

## 2022-02-17 ENCOUNTER — Telehealth: Payer: Self-pay

## 2022-02-17 NOTE — Telephone Encounter (Signed)
02/15/22 labs received from patients pcp Curtis Sites with [facility]. Forward to Dr Norman Herrlich for review

## 2022-02-22 DIAGNOSIS — M75121 Complete rotator cuff tear or rupture of right shoulder, not specified as traumatic: Secondary | ICD-10-CM | POA: Diagnosis not present

## 2022-02-22 DIAGNOSIS — Z9889 Other specified postprocedural states: Secondary | ICD-10-CM | POA: Diagnosis not present

## 2022-02-22 DIAGNOSIS — M65342 Trigger finger, left ring finger: Secondary | ICD-10-CM | POA: Diagnosis not present

## 2022-02-22 DIAGNOSIS — M7541 Impingement syndrome of right shoulder: Secondary | ICD-10-CM | POA: Diagnosis not present

## 2022-03-08 DIAGNOSIS — M25611 Stiffness of right shoulder, not elsewhere classified: Secondary | ICD-10-CM | POA: Diagnosis not present

## 2022-03-08 DIAGNOSIS — Z789 Other specified health status: Secondary | ICD-10-CM | POA: Diagnosis not present

## 2022-03-08 DIAGNOSIS — Z7409 Other reduced mobility: Secondary | ICD-10-CM | POA: Diagnosis not present

## 2022-03-08 DIAGNOSIS — Z9889 Other specified postprocedural states: Secondary | ICD-10-CM | POA: Diagnosis not present

## 2022-03-08 DIAGNOSIS — R29898 Other symptoms and signs involving the musculoskeletal system: Secondary | ICD-10-CM | POA: Diagnosis not present

## 2022-03-10 DIAGNOSIS — R0683 Snoring: Secondary | ICD-10-CM | POA: Diagnosis not present

## 2022-03-10 DIAGNOSIS — G44219 Episodic tension-type headache, not intractable: Secondary | ICD-10-CM | POA: Diagnosis not present

## 2022-03-10 DIAGNOSIS — R519 Headache, unspecified: Secondary | ICD-10-CM | POA: Diagnosis not present

## 2022-03-10 DIAGNOSIS — G4710 Hypersomnia, unspecified: Secondary | ICD-10-CM | POA: Diagnosis not present

## 2022-03-27 ENCOUNTER — Ambulatory Visit
Admission: RE | Admit: 2022-03-27 | Discharge: 2022-03-27 | Disposition: A | Discharge status: Home / Self Care | Payer: PPO | Source: Ambulatory Visit | Attending: Family Medicine | Admitting: Family Medicine

## 2022-03-27 DIAGNOSIS — N281 Cyst of kidney, acquired: Secondary | ICD-10-CM

## 2022-03-28 DIAGNOSIS — N2889 Other specified disorders of kidney and ureter: Secondary | ICD-10-CM | POA: Diagnosis not present

## 2022-03-28 MED ORDER — GADOPICLENOL 0.5 MMOL/ML IV SOLN
10.0000 mL | Freq: Once | INTRAVENOUS | Status: AC | PRN
  Administered 2022-03-28: 10 mL via INTRAVENOUS

## 2022-04-05 DIAGNOSIS — Z789 Other specified health status: Secondary | ICD-10-CM | POA: Diagnosis not present

## 2022-04-05 DIAGNOSIS — Z7409 Other reduced mobility: Secondary | ICD-10-CM | POA: Diagnosis not present

## 2022-04-05 DIAGNOSIS — M25611 Stiffness of right shoulder, not elsewhere classified: Secondary | ICD-10-CM | POA: Diagnosis not present

## 2022-04-05 DIAGNOSIS — Z9889 Other specified postprocedural states: Secondary | ICD-10-CM | POA: Diagnosis not present

## 2022-04-05 DIAGNOSIS — R29898 Other symptoms and signs involving the musculoskeletal system: Secondary | ICD-10-CM | POA: Diagnosis not present

## 2022-04-21 ENCOUNTER — Other Ambulatory Visit: Payer: Self-pay | Admitting: Family Medicine

## 2022-04-21 DIAGNOSIS — M8589 Other specified disorders of bone density and structure, multiple sites: Secondary | ICD-10-CM

## 2022-04-26 DIAGNOSIS — G44219 Episodic tension-type headache, not intractable: Secondary | ICD-10-CM | POA: Diagnosis not present

## 2022-04-26 DIAGNOSIS — R519 Headache, unspecified: Secondary | ICD-10-CM | POA: Diagnosis not present

## 2022-04-26 DIAGNOSIS — R9082 White matter disease, unspecified: Secondary | ICD-10-CM | POA: Diagnosis not present

## 2022-04-28 ENCOUNTER — Other Ambulatory Visit: Payer: Self-pay | Admitting: Family Medicine

## 2022-04-28 DIAGNOSIS — M8589 Other specified disorders of bone density and structure, multiple sites: Secondary | ICD-10-CM

## 2022-05-25 ENCOUNTER — Other Ambulatory Visit: Payer: Self-pay

## 2022-05-25 ENCOUNTER — Emergency Department (HOSPITAL_BASED_OUTPATIENT_CLINIC_OR_DEPARTMENT_OTHER)
Admission: EM | Admit: 2022-05-25 | Discharge: 2022-05-25 | Disposition: A | Discharge status: Home / Self Care | Payer: PPO | Attending: Emergency Medicine | Admitting: Emergency Medicine

## 2022-05-25 ENCOUNTER — Emergency Department (HOSPITAL_BASED_OUTPATIENT_CLINIC_OR_DEPARTMENT_OTHER): Payer: PPO

## 2022-05-25 ENCOUNTER — Other Ambulatory Visit (HOSPITAL_BASED_OUTPATIENT_CLINIC_OR_DEPARTMENT_OTHER): Payer: Self-pay

## 2022-05-25 DIAGNOSIS — M549 Dorsalgia, unspecified: Secondary | ICD-10-CM | POA: Diagnosis not present

## 2022-05-25 DIAGNOSIS — R1011 Right upper quadrant pain: Secondary | ICD-10-CM | POA: Insufficient documentation

## 2022-05-25 DIAGNOSIS — R0781 Pleurodynia: Secondary | ICD-10-CM | POA: Diagnosis not present

## 2022-05-25 DIAGNOSIS — R9431 Abnormal electrocardiogram [ECG] [EKG]: Secondary | ICD-10-CM | POA: Diagnosis not present

## 2022-05-25 DIAGNOSIS — K5730 Diverticulosis of large intestine without perforation or abscess without bleeding: Secondary | ICD-10-CM | POA: Diagnosis not present

## 2022-05-25 DIAGNOSIS — R109 Unspecified abdominal pain: Secondary | ICD-10-CM | POA: Diagnosis not present

## 2022-05-25 DIAGNOSIS — M545 Low back pain, unspecified: Secondary | ICD-10-CM | POA: Diagnosis not present

## 2022-05-25 LAB — CBC WITH DIFFERENTIAL/PLATELET
Abs Immature Granulocytes: 0.02 10*3/uL (ref 0.00–0.07)
Basophils Absolute: 0.1 10*3/uL (ref 0.0–0.1)
Basophils Relative: 1 %
Eosinophils Absolute: 0.7 10*3/uL — ABNORMAL HIGH (ref 0.0–0.5)
Eosinophils Relative: 12 %
HCT: 40 % (ref 39.0–52.0)
Hemoglobin: 14.3 g/dL (ref 13.0–17.0)
Immature Granulocytes: 0 %
Lymphocytes Relative: 17 %
Lymphs Abs: 1 10*3/uL (ref 0.7–4.0)
MCH: 32.7 pg (ref 26.0–34.0)
MCHC: 35.8 g/dL (ref 30.0–36.0)
MCV: 91.5 fL (ref 80.0–100.0)
Monocytes Absolute: 0.7 10*3/uL (ref 0.1–1.0)
Monocytes Relative: 12 %
Neutro Abs: 3.2 10*3/uL (ref 1.7–7.7)
Neutrophils Relative %: 58 %
Platelets: 267 10*3/uL (ref 150–400)
RBC: 4.37 MIL/uL (ref 4.22–5.81)
RDW: 12.4 % (ref 11.5–15.5)
WBC: 5.6 10*3/uL (ref 4.0–10.5)
nRBC: 0 % (ref 0.0–0.2)

## 2022-05-25 LAB — COMPREHENSIVE METABOLIC PANEL
ALT: 19 U/L (ref 0–44)
AST: 23 U/L (ref 15–41)
Albumin: 4.1 g/dL (ref 3.5–5.0)
Alkaline Phosphatase: 66 U/L (ref 38–126)
Anion gap: 6 (ref 5–15)
BUN: 14 mg/dL (ref 8–23)
CO2: 25 mmol/L (ref 22–32)
Calcium: 9.1 mg/dL (ref 8.9–10.3)
Chloride: 103 mmol/L (ref 98–111)
Creatinine, Ser: 1.01 mg/dL (ref 0.61–1.24)
GFR, Estimated: >60 mL/min (ref >60)
Glucose, Bld: 104 mg/dL — ABNORMAL HIGH (ref 70–99)
Potassium: 4.4 mmol/L (ref 3.5–5.1)
Sodium: 134 mmol/L — ABNORMAL LOW (ref 135–145)
Total Bilirubin: 0.9 mg/dL (ref 0.3–1.2)
Total Protein: 7.2 g/dL (ref 6.5–8.1)

## 2022-05-25 LAB — LIPASE, BLOOD: Lipase: 37 U/L (ref 11–51)

## 2022-05-25 MED ORDER — FENTANYL CITRATE PF 50 MCG/ML IJ SOSY
50.0000 ug | PREFILLED_SYRINGE | Freq: Once | INTRAMUSCULAR | Status: AC
  Administered 2022-05-25: 50 ug via INTRAVENOUS
  Filled 2022-05-25: qty 1

## 2022-05-25 MED ORDER — LIDOCAINE 5 % EX PTCH
1.0000 | MEDICATED_PATCH | CUTANEOUS | 0 refills | Status: DC
  Filled 2022-05-25: qty 30, 30d supply, fill #0

## 2022-05-25 MED ORDER — KETOROLAC TROMETHAMINE 15 MG/ML IJ SOLN
15.0000 mg | Freq: Once | INTRAMUSCULAR | Status: AC
  Administered 2022-05-25: 15 mg via INTRAVENOUS
  Filled 2022-05-25: qty 1

## 2022-05-25 MED ORDER — METHYLPREDNISOLONE 4 MG PO TBPK
ORAL_TABLET | ORAL | 0 refills | Status: DC
  Filled 2022-05-25: qty 21, 6d supply, fill #0

## 2022-05-25 MED ORDER — CYCLOBENZAPRINE HCL 10 MG PO TABS
10.0000 mg | ORAL_TABLET | Freq: Two times a day (BID) | ORAL | 0 refills | Status: DC | PRN
  Filled 2022-05-25: qty 20, 10d supply, fill #0

## 2022-05-25 NOTE — ED Provider Notes (Signed)
[facility] Provider Note   CSN: [id number] Arrival date & time: 05/25/22  [id number]     History  Chief Complaint  Patient presents with   Back Pain    Cameron Stout is a 72 y.o. male.  Patient here with right flank pain now radiating to the right upper abdomen for the last few days.  Nothing makes it worse or better.  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  Does not think eating or drinking makes it worse.  Denies any history of abdominal surgeries.  Denies any chest pain, shortness of breath, fever, chills.  No nausea vomiting or diarrhea.  Denies any infectious symptoms.  The history is provided by the patient.       Home Medications Prior to Admission medications   Medication Sig Start Date End Date Taking? Authorizing Provider  cyclobenzaprine (FLEXERIL) 10 MG tablet Take 1 tablet (10 mg total) by mouth 2 (two) times daily as needed for muscle spasms. 05/25/22  Yes Remie Mathison, DO  lidocaine (LIDODERM) 5 % Place 1 patch onto the skin daily. Remove & Discard patch within 12 hours or as directed by MD 05/25/22  Yes Ronnald Nian, Mariea Mcmartin, DO  methylPREDNISolone (MEDROL DOSEPAK) 4 MG TBPK tablet Follow package insert 05/25/22  Yes Kiaya Haliburton, DO  aspirin EC 81 MG tablet Take 81 mg by mouth daily.    [provider]  dexlansoprazole (DEXILANT) 60 MG capsule Take 60 mg by mouth daily.    [provider]  Eszopiclone 3 MG TABS Take 1 tablet (3 mg total) by mouth at bedtime. Take immediately before bedtime 01/17/22   Cottle, Billey Co., MD  Evolocumab (REPATHA SURECLICK) [id number] MG/ML SOAJ INJECT 140 MG INTO THE SKIN EVERY 14 (FOURTEEN) DAYS. 01/19/22   Richardo Priest, MD  ezetimibe (ZETIA) 10 MG tablet Take 10 mg by mouth every morning.    [provider]  loratadine (CLARITIN) 10 MG tablet Take 10 mg by mouth daily.    [provider]   OXcarbazepine (TRILEPTAL) 150 MG tablet 1 twice daily and 2 tablets at night 01/17/22   Cottle, Billey Co., MD  propranolol (INDERAL) 20 MG tablet TAKE 1 TO 2 TABLETS BY MOUTH TWICE A DAY AS NEEDED FOR ANXIETY 01/17/22   Cottle, Billey Co., MD      Allergies    Atorvastatin    Review of Systems   Review of Systems  Physical Exam Updated Vital Signs BP (!) 154/94 (BP Location: Right Arm)   Pulse (!) 54   Temp 97.6 F (36.4 C) (Oral)   Resp 16   Ht '5\' 10"'$  (1.778 m)   Wt 90.7 kg   SpO2 97%   BMI 28.70 kg/m  Physical Exam Vitals and nursing note reviewed.  Constitutional:      General: He is not in acute distress.    Appearance: He is well-developed. He is not ill-appearing.  HENT:     Head: Normocephalic and atraumatic.     Nose: Nose normal.     Mouth/Throat:     Mouth: Mucous membranes are moist.  Eyes:     Extraocular Movements: Extraocular movements intact.     Conjunctiva/sclera: Conjunctivae normal.     Pupils: Pupils are equal, round, and reactive to light.  Cardiovascular:     Rate and Rhythm: Normal rate and regular rhythm.  Pulses: Normal pulses.     Heart sounds: Normal heart sounds. No murmur heard. Pulmonary:     Effort: Pulmonary effort is normal. No respiratory distress.     Breath sounds: Normal breath sounds.  Abdominal:     Palpations: Abdomen is soft.     Tenderness: There is abdominal tenderness.     Comments: Tenderness over the right CVA, right anterior ribs, right upper quadrant  Musculoskeletal:        General: Tenderness present. No swelling. Normal range of motion.     Cervical back: Normal range of motion and neck supple.     Comments: Right CVA tenderness  Skin:    General: Skin is warm and dry.     Capillary Refill: Capillary refill takes less than 2 seconds.  Neurological:     General: No focal deficit present.     Mental Status: He is alert and oriented to person, place, and time.     Cranial Nerves: No cranial nerve deficit.      Sensory: No sensory deficit.     Motor: No weakness.     Coordination: Coordination normal.     Comments: 5+ out of 5 strength throughout, normal sensation, normal finger-nose-finger, speech  Psychiatric:        Mood and Affect: Mood normal.     ED Results / Procedures / Treatments   Labs (all labs ordered are listed, but only abnormal results are displayed) Labs Reviewed  CBC WITH DIFFERENTIAL/PLATELET - Abnormal; Notable for the following components:      Result Value   Eosinophils Absolute 0.7 (*)    All other components within normal limits  COMPREHENSIVE METABOLIC PANEL - Abnormal; Notable for the following components:   Sodium 134 (*)    Glucose, Bld 104 (*)    All other components within normal limits  LIPASE, BLOOD    EKG EKG Interpretation  Date/Time:  Wednesday May 25 2022 08:38:38 EST Ventricular Rate:  50 PR Interval:  233 QRS Duration: 113 QT Interval:  442 QTC Calculation: 403 R Axis:   18 Text Interpretation: Sinus rhythm Prolonged PR interval Borderline intraventricular conduction delay Borderline low voltage, extremity leads Confirmed by Lennice Sites ([id number]) on 05/25/2022 8:48:53 AM  Radiology CT Renal Stone Study  Result Date: 05/25/2022 CLINICAL DATA:  Right flank pain for the past 2 days. EXAM: CT ABDOMEN AND PELVIS WITHOUT CONTRAST TECHNIQUE: Multidetector CT imaging of the abdomen and pelvis was performed following the standard protocol without IV contrast. RADIATION DOSE REDUCTION: This exam was performed according to the departmental dose-optimization program which includes automated exposure control, adjustment of the mA and/or kV according to patient size and/or use of iterative reconstruction technique. COMPARISON:  Abdomen MRI dated 03/28/2022. FINDINGS: Lower chest: Small right lower lobe calcified granuloma. Small number of additional 2-3 mm noncalcified nodules in both lower lobes. Enlarged heart. Hepatobiliary: No focal liver abnormality is  seen. No gallstones, gallbladder wall thickening, or biliary dilatation. Pancreas: Unremarkable. No pancreatic ductal dilatation or surrounding inflammatory changes. Spleen: Normal in size without focal abnormality. Adrenals/Urinary Tract: Normal-appearing adrenal glands. 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 Small simple left renal cyst. Unremarkable ureters and urinary bladder. No urinary tract calculi or hydronephrosis. Stomach/Bowel: Multiple sigmoid and distal descending colon diverticula without evidence of diverticulitis. The appendix is not visualized. No evidence of appendicitis. Unremarkable stomach and small bowel. Vascular/Lymphatic: Atheromatous arterial calcifications without aneurysm. No enlarged lymph nodes. Reproductive: Prostate is unremarkable. Other: Very  small supraumbilical hernia containing fat. Musculoskeletal: Lumbar and lower thoracic spine degenerative changes. IMPRESSION: 1. No acute abnormality. 2. Colonic diverticulosis. 3. 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 4. Small number of 2-3 mm noncalcified nodules in both lower lobes as well as a small calcified granuloma in the right lower lobe. No follow-up needed if patient is low-risk (and has no known or suspected primary neoplasm). Non-contrast chest CT can be considered in 12 months if patient is high-risk. This recommendation follows the consensus statement: Guidelines for Management of Incidental Pulmonary Nodules Detected on CT Images: From the Fleischner Society 2017; Radiology 2017; 284:228-243. Electronically Signed   By: Claudie Revering M.D.   On: 05/25/2022 09:05    Procedures Procedures    Medications Ordered in ED Medications  ketorolac (TORADOL) 15 MG/ML injection 15 mg (has no administration in time range)  fentaNYL (SUBLIMAZE) injection 50 mcg (50 mcg Intravenous  Given 05/25/22 [id number])    ED Course/ Medical Decision Making/ A&P                             Medical Decision Making Amount and/or Complexity of Data Reviewed Labs: ordered. Radiology: ordered.  Risk Prescription drug management.   Abshir Moris Helle is here with right flank pain.  Normal vitals.  No fever.  No significant comorbidities.  Differential diagnosis is kidney stone versus MSK pain versus cholecystitis, pancreatitis.  Seems less likely to be bowel obstruction.  EKG shows sinus rhythm.  No concern for ACS or PE or other acute pulmonary cardiac process.  Will get CBC, CMP, lipase, CT scan abdomen pelvis.  Will give a dose IV fentanyl and reevaluate.  Per my review and interpretation labs no significant anemia or electrolyte abnormality or kidney injury.  CT scan is unremarkable for any acute findings.  He has some nodules that he is already aware of and is having MRIs being followed by this.  Will have him follow-up with his primary care doctor.  Discharged in good condition.  Understands return precautions.  This chart was dictated using voice recognition software.  Despite best efforts to proofread,  errors can occur which can change the documentation meaning.         Final Clinical Impression(s) / ED Diagnoses Final diagnoses:  Acute right-sided back pain, unspecified back location    Rx / DC Orders ED Discharge Orders          Ordered    lidocaine (LIDODERM) 5 %  Every 24 hours        05/25/22 0915    cyclobenzaprine (FLEXERIL) 10 MG tablet  2 times daily PRN        05/25/22 0915    methylPREDNISolone (MEDROL DOSEPAK) 4 MG TBPK tablet        05/25/22 0915              Sharnay Cashion, Quita Skye, DO 05/25/22 [phone]

## 2022-05-25 NOTE — ED Triage Notes (Signed)
Pt presents via pov with complaints of left sided back pain X2 days. Denies other symptoms. Denies injury.

## 2022-05-25 NOTE — Discharge Instructions (Signed)
Overall suspect your pain is muscular.  Use lidocaine patches as prescribed.  Take Medrol Dosepak as prescribed.  I have prescribed you a muscle relaxant called cyclobenzaprine which is mildly sedating so do not drive or do dangerous activities while taking the medicine.  I also recommend 1000 mg of Tylenol every 6 hours as needed for pain.  Follow-up with your primary care doctor.

## 2022-05-30 DIAGNOSIS — M25551 Pain in right hip: Secondary | ICD-10-CM

## 2022-05-30 HISTORY — DX: Pain in right hip: M25.551

## 2022-06-04 IMAGING — CT CT ANGIO CHEST
3 of 9 series · 18 of 46 positions shown · IV contrast (Omnipaque)
Comparison: 01/30/2020 CT coronary calcium score

CLINICAL DATA: Thoracic aortic aneurysm, surveillance imaging

EXAM:
CT ANGIOGRAPHY CHEST WITH CONTRAST
TECHNIQUE: Multidetector CT imaging of the chest was performed using the
standard protocol during bolus administration of intravenous
contrast. Multiplanar CT image reconstructions and MIPs were
obtained to evaluate the vascular anatomy.
CONTRAST:  100mL OMNIPAQUE IOHEXOL 350 MG/ML SOLN

[Series 5: axial arterial · axial · arterial · 0.89mm/px · z∈[+1057,+1354]mm · 12 of 119 slices shown]
[im 10/119  lung]
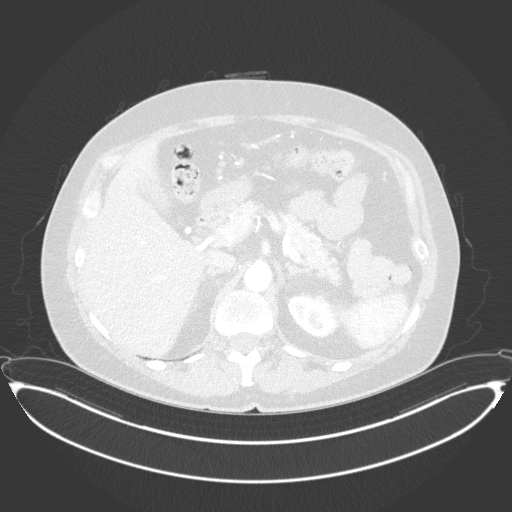
[im 19/119  soft-tissue]
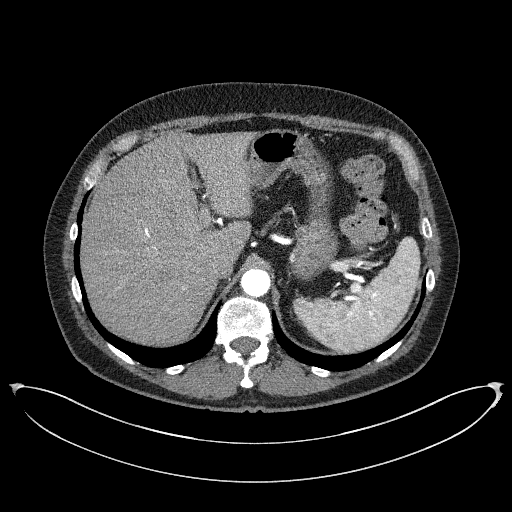
[im 28/119  lung]
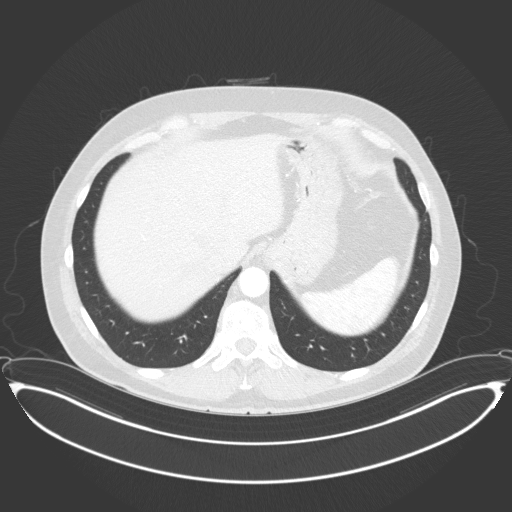
[im 37/119  soft-tissue]
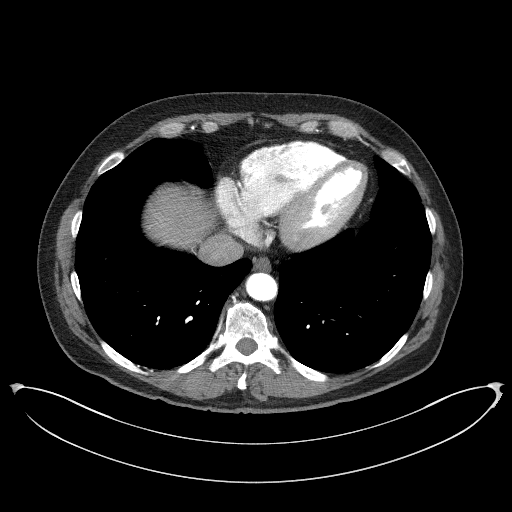
[im 46/119  lung]
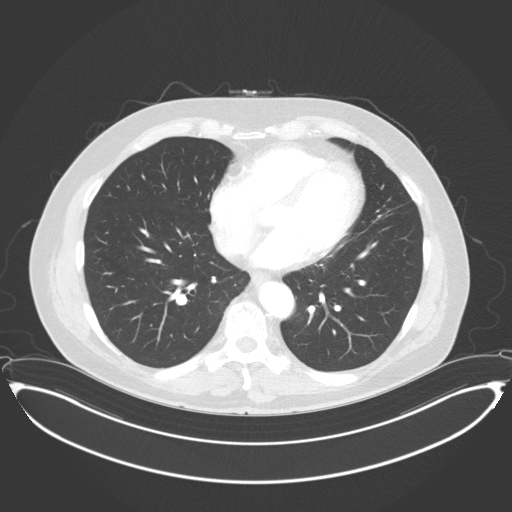
[im 55/119  soft-tissue]
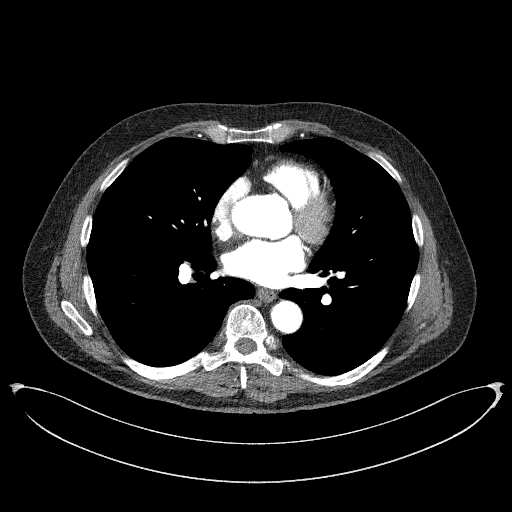
[im 64/119  lung]
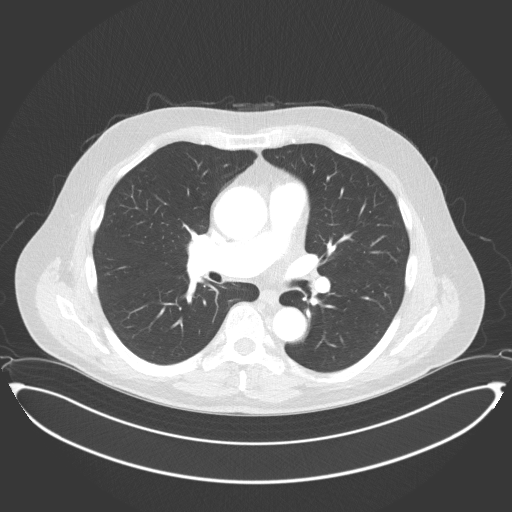
[im 73/119  soft-tissue]
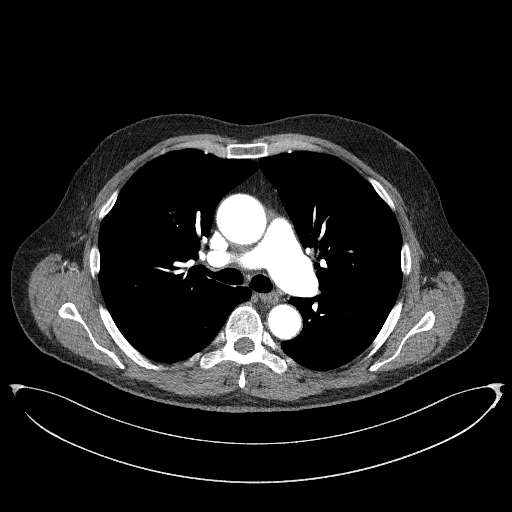
[im 82/119  lung]
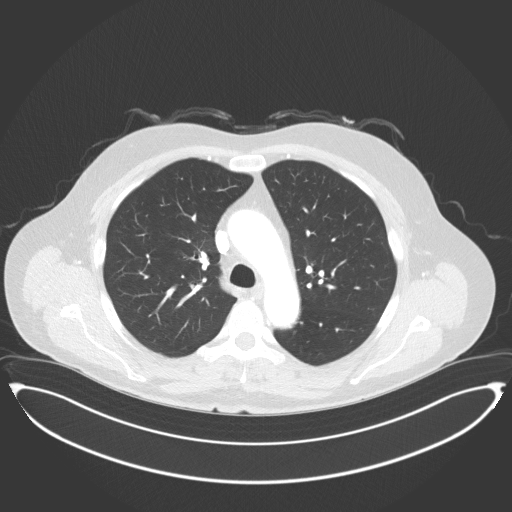
[im 91/119  soft-tissue]
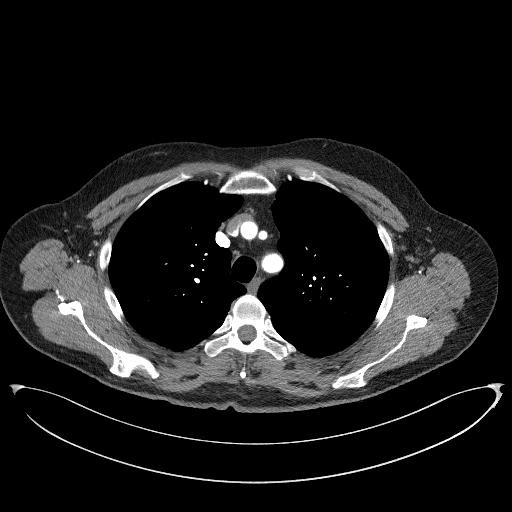
[im 100/119  lung]
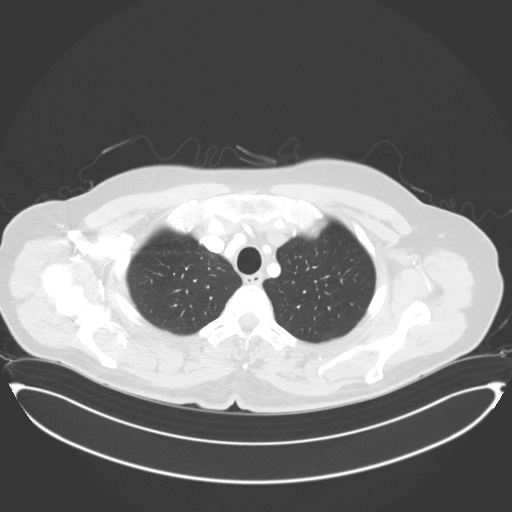
[im 109/119  soft-tissue]
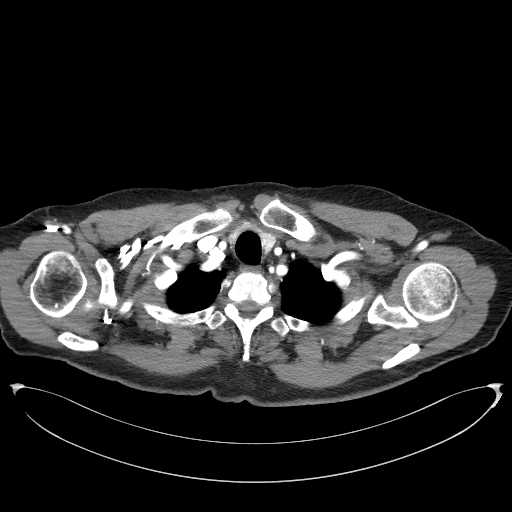

[Series 7: lung · axial · 0.89mm/px · z∈[+1109,+1199]mm · 3 of 65 slices shown]
[im 10/65  soft-tissue]
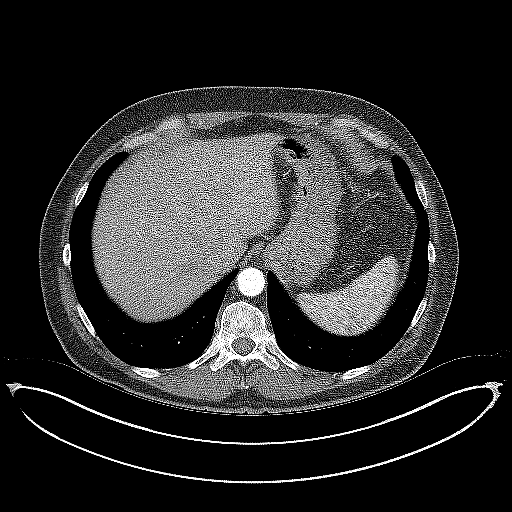
[im 19/65  soft-tissue]
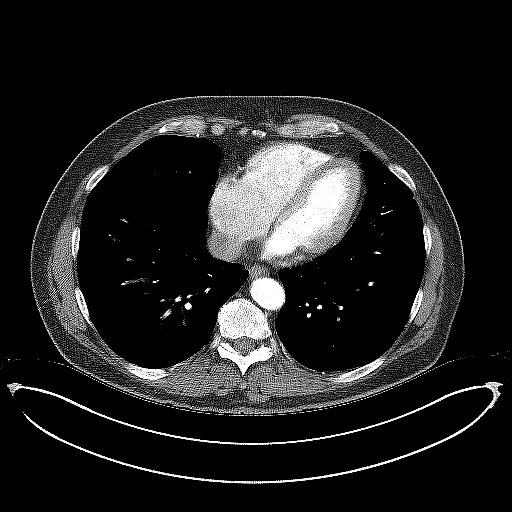
[im 28/65  soft-tissue]
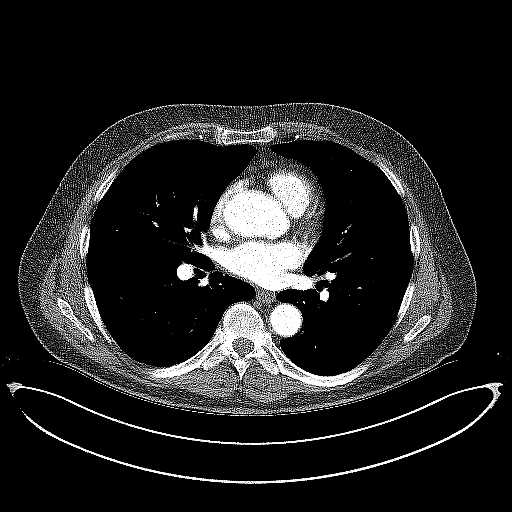

[Series 8: coronals · coronal · 0.80mm/px · 3 of 160 slices shown]
[im 40/160  soft-tissue]
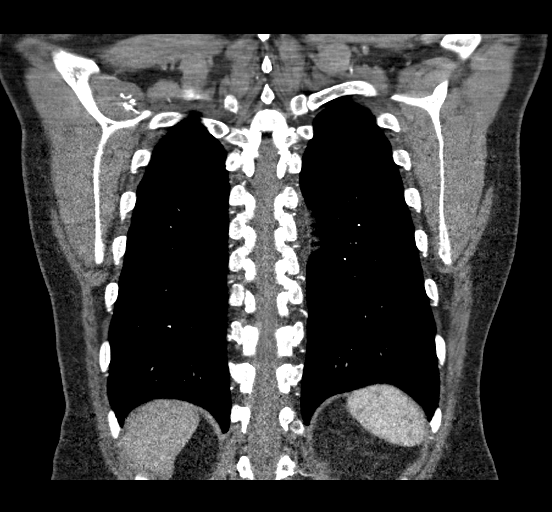
[im 80/160  soft-tissue]
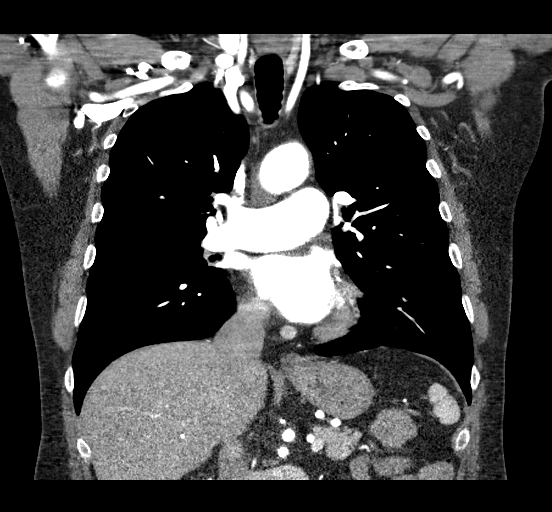
[im 120/160  soft-tissue]
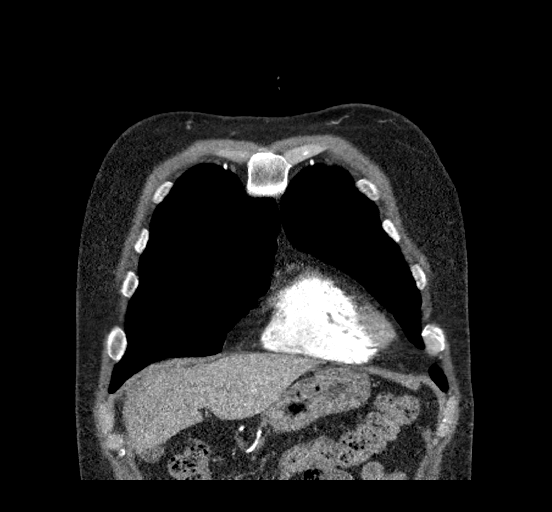

[18 of 46 positions shown; findings below may reference images not displayed]

FINDINGS: Cardiovascular: There is stable mild fusiform aneurysmal dilatation
of the ascending thoracic aorta, maximal diameter 44 mm, previously
45 mm.

Remainder of the aorta is normal in caliber. Minor atherosclerotic
change. Patent 3 vessel arch anatomy. No dissection. Negative for
mediastinal hemorrhage or hematoma.

Central pulmonary arteries are patent. No significant filling defect
or pulmonary embolus by CTA.

Calcifications of the aortic valve noted. Native coronary
atherosclerosis. Normal heart size. No pericardial effusion.

Central venous structures are patent.  No Muneki process.

Mediastinum/Nodes: No enlarged mediastinal, hilar, or axillary lymph
nodes. Thyroid gland, trachea, and esophagus demonstrate no
significant findings.

Lungs/Pleura: Stable 4 mm calcified granuloma in the left upper
lobe, image [DATE]. Minor bandlike scarring in the lingula.

No acute airspace process, significant collapse or consolidation. No
interstitial process or edema.

No pleural abnormality, effusion, or pneumothorax.

Trachea and central airways are patent.

Upper Abdomen: Benign 12 mm left upper pole renal cyst noted. No
acute upper abdominal finding. Minor abdominal aortic
atherosclerosis. Celiac and SMA are patent.

Musculoskeletal: Minor spondylosis of the thoracic spine. No chest
wall soft tissue asymmetry or focal abnormality.

Review of the MIP images confirms the above findings.
IMPRESSION: Stable aneurysmal dilatation of the ascending thoracic aorta,
maximal diameter 44 mm. Remainder of the aorta normal in caliber.

Recommend annual imaging followup by CTA or MRA. This recommendation
follows 8868 ACCF/AHA/AATS/ACR/ASA/SCA/EVETTE/BOUIE/NADJOUA/MAHDAVI Guidelines
for the Diagnosis and Management of Patients with Thoracic Aortic
Disease. Circulation. 8868; 121: [id number]. Aortic aneurysm NOS
(NSMN3-SVE.S)

No other acute intrathoracic finding by CTA.

Aortic Atherosclerosis (NSMN3-OBF.F).

Aortic aneurysm NOS (NSMN3-SVE.S).

## 2022-06-08 DIAGNOSIS — M79651 Pain in right thigh: Secondary | ICD-10-CM | POA: Diagnosis not present

## 2022-06-08 DIAGNOSIS — R262 Difficulty in walking, not elsewhere classified: Secondary | ICD-10-CM | POA: Diagnosis not present

## 2022-06-08 DIAGNOSIS — M25551 Pain in right hip: Secondary | ICD-10-CM | POA: Diagnosis not present

## 2022-06-15 DIAGNOSIS — R262 Difficulty in walking, not elsewhere classified: Secondary | ICD-10-CM | POA: Diagnosis not present

## 2022-06-15 DIAGNOSIS — M79651 Pain in right thigh: Secondary | ICD-10-CM | POA: Diagnosis not present

## 2022-06-15 DIAGNOSIS — M25551 Pain in right hip: Secondary | ICD-10-CM | POA: Diagnosis not present

## 2022-06-22 DIAGNOSIS — M25551 Pain in right hip: Secondary | ICD-10-CM | POA: Diagnosis not present

## 2022-06-22 DIAGNOSIS — R262 Difficulty in walking, not elsewhere classified: Secondary | ICD-10-CM | POA: Diagnosis not present

## 2022-06-22 DIAGNOSIS — M79651 Pain in right thigh: Secondary | ICD-10-CM | POA: Diagnosis not present

## 2022-06-30 DIAGNOSIS — M6530 Trigger finger, unspecified finger: Secondary | ICD-10-CM | POA: Insufficient documentation

## 2022-06-30 HISTORY — DX: Trigger finger, unspecified finger: M65.30

## 2022-07-01 DIAGNOSIS — G4700 Insomnia, unspecified: Secondary | ICD-10-CM | POA: Diagnosis not present

## 2022-07-01 DIAGNOSIS — Z1330 Encounter for screening examination for mental health and behavioral disorders, unspecified: Secondary | ICD-10-CM | POA: Diagnosis not present

## 2022-07-01 DIAGNOSIS — G4710 Hypersomnia, unspecified: Secondary | ICD-10-CM | POA: Diagnosis not present

## 2022-07-12 DIAGNOSIS — G4730 Sleep apnea, unspecified: Secondary | ICD-10-CM | POA: Diagnosis not present

## 2022-07-12 DIAGNOSIS — R0683 Snoring: Secondary | ICD-10-CM | POA: Diagnosis not present

## 2022-07-14 ENCOUNTER — Other Ambulatory Visit: Payer: Self-pay | Admitting: Cardiology

## 2022-07-14 NOTE — Telephone Encounter (Signed)
Repatha refills to pharmacy

## 2022-07-19 ENCOUNTER — Ambulatory Visit: Payer: PPO | Admitting: Psychiatry

## 2022-08-20 DIAGNOSIS — J209 Acute bronchitis, unspecified: Secondary | ICD-10-CM | POA: Diagnosis not present

## 2022-08-23 DIAGNOSIS — M65342 Trigger finger, left ring finger: Secondary | ICD-10-CM | POA: Diagnosis not present

## 2022-08-24 ENCOUNTER — Ambulatory Visit: Payer: PPO | Admitting: Psychiatry

## 2022-08-31 ENCOUNTER — Other Ambulatory Visit: Payer: Self-pay | Admitting: Psychiatry

## 2022-08-31 DIAGNOSIS — F5105 Insomnia due to other mental disorder: Secondary | ICD-10-CM

## 2022-10-03 ENCOUNTER — Other Ambulatory Visit: Payer: Self-pay | Admitting: Family Medicine

## 2022-10-03 DIAGNOSIS — N281 Cyst of kidney, acquired: Secondary | ICD-10-CM

## 2022-11-01 ENCOUNTER — Encounter: Payer: Self-pay | Admitting: Psychiatry

## 2022-11-01 ENCOUNTER — Ambulatory Visit: Payer: PPO | Admitting: Psychiatry

## 2022-11-01 DIAGNOSIS — F39 Unspecified mood [affective] disorder: Secondary | ICD-10-CM

## 2022-11-01 DIAGNOSIS — F418 Other specified anxiety disorders: Secondary | ICD-10-CM | POA: Diagnosis not present

## 2022-11-01 DIAGNOSIS — F5105 Insomnia due to other mental disorder: Secondary | ICD-10-CM

## 2022-11-01 MED ORDER — OXCARBAZEPINE 150 MG PO TABS: ORAL_TABLET | ORAL | 4 refills | Status: DC

## 2022-11-01 MED ORDER — ESZOPICLONE 3 MG PO TABS: 3.0000 mg | ORAL_TABLET | Freq: Every day | ORAL | 2 refills | Status: DC

## 2022-11-01 NOTE — Progress Notes (Signed)
Cameron Stout [id number] Aug 06, 1950 72 y.o.  Subjective:   Patient ID:  Cameron Stout is a 72 y.o. (DOB 16-Oct-1950) male.  Chief Complaint:  Chief Complaint  Patient presents with   Follow-up    HPI Cameron Stout presents to the office today for follow-up of episodic mood disorder.  seen Sept 2020.  No meds were changed.  He remained on Trileptal 150 mg tablet one half every morning and 2 tablets every afternoon, Lunesta 2 mg nightly as needed insomnia  05/24/19 appt with the following noted: Forcing to partner or join big group and it's stresful and trying to decide what to do.  Affects his sleep.  Parnter not handling it well.  Lunesta helps.   Disc retirement and need for something to do when he cuts back work.   He plans to gradually cut back and not stop. Overall OK.  During high stress March-May increased Trileptal 2 tab BID and then able to cut it back.  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  Big decision.  Plans to only work 3 days/week.  Plans to continue that for 6 mos and reassess. Busy with side businesses rental houses.   Mood has been stable.  Stress can affect sleep and some irritability at times. Still rides motorcycle for fun.  No complaints with meds. 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  Some patients upset over his retirement.  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   Remodeling house.  Staying busy mostly.  Exercising and going to gym.  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   Taking bike trips now with a group.  Has some trips.  Mood is better now that transition is further down the road.  Mood is fine.  Tends to be emotional easily.  12/09/20 appt noted: Slowed down in work but 3 days per week.  Took yearly  trip to [place].  D moving back from [place]. Going to [place].  Doing fine.  No complaints. Upcoming speaking situations generating  anxiety. Patient reports stable mood and denies depressed but mild manageable irritable moods.  Patient denies any recent difficulty with anxiety.  Patient denies difficulty with sleep initiation or maintenance. Rare Lunesta. Average 6 hours sleep is good.  Sleep is good generally unless occ pain. Denies appetite disturbance.  Patient reports that energy and motivation have been good.  Patient denies any difficulty with concentration. Patient denies any suicidal ideation. Plan: No med changes indicated .  Option increase Trileptal to 300 BID during the stress. Propranolol prn for perfoming anxiety  06/02/2021 appt noted: Doing fine.  Slowing down at work is tricky and some days anxious delaing with it.  Up to him when to stop and plans to stop end of the year.   Can't be as generous as he used to be now that corporate take over.   Patient reports stable mood and denies depressed or irritable moods.   Patient denies difficulty with sleep initiation or maintenance. Denies appetite disturbance.  Patient reports that energy and motivation have been good.  Patient denies any difficulty with concentration.  Patient denies any suicidal ideation. Plan no changes  01/17/22 appt noted: Good overall.  Still working on retirement plans. Shoulder surgery several weeks ago. Not ready to retire yet.  Scared to not be busy enough.   Still issues with sleep bc hard to sleep  with shoulder surgery.   Getting follow up of abd aorta.  Can still walk several miles. Was in the gym before shoulder surgery. Mood has been good.  Anxiety got married and he used propranolol for public speaking.  Got through the wedding fine.  D got married.     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  Will likely have to reevaluate whether to continue PT work or not.  Not sure what will happen.   Otherwise mood is good. Creates some anxiety.   No sig hobby other than motorcycles.  In rides for 40 years.   Uses  Lunesta prn.  Usually only 1.5 mg HS.  Otherwise gets hangover.   Still exerise.  Staying busy. Patient reports stable mood and denies depressed or irritable moods.  Patient denies any recent difficulty with anxiety.  Denies appetite disturbance.  Patient reports that energy and motivation have been good.  Patient denies any difficulty with concentration.  Patient denies any suicidal ideation.   Past Psychiatric Medication Trials: Lithium 750, Depakote, Equetro, Trileptal since 2015,  Lunesta 3 mg hangover,  ProSom Trazodone hangover. propranolol, fluoxetine, sertraline,  Under care of this practice since 2007  Review of Systems:  Review of Systems  Constitutional:  Negative for fatigue.  Cardiovascular:  Negative for palpitations.  Musculoskeletal:  Positive for arthralgias.  Neurological:  Negative for dizziness, tremors and weakness.  Psychiatric/Behavioral:  The patient is not nervous/anxious.     Medications: I have reviewed the patient's current medications.  Current Outpatient Medications  Medication Sig Dispense Refill   aspirin EC 81 MG tablet Take 81 mg by mouth daily.     cyclobenzaprine (FLEXERIL) 10 MG tablet Take 1 tablet (10 mg total) by mouth 2 (two) times daily as needed for muscle spasms. 20 tablet 0   dexlansoprazole (DEXILANT) 60 MG capsule Take 60 mg by mouth daily.     Evolocumab (REPATHA SURECLICK) 140 MG/ML SOAJ INJECT 140 MG INTO THE SKIN EVERY 14 (FOURTEEN) DAYS. 6 mL 2   ezetimibe (ZETIA) 10 MG tablet Take 10 mg by mouth every morning.     lidocaine (LIDODERM) 5 % Place 1 patch onto the skin daily. Remove & Discard patch within 12 hours or as directed by MD 30 patch 0   loratadine (CLARITIN) 10 MG tablet Take 10 mg by mouth daily.     methylPREDNISolone (MEDROL DOSEPAK) 4 MG TBPK tablet Follow package insert 21 each 0   propranolol (INDERAL) 20 MG tablet TAKE 1 TO 2 TABLETS BY MOUTH TWICE A DAY AS NEEDED FOR ANXIETY 360 tablet 1   Eszopiclone 3 MG TABS  Take 1 tablet (3 mg total) by mouth at bedtime. Take immediately before bedtime 30 tablet 2   OXcarbazepine (TRILEPTAL) 150 MG tablet 1 twice daily and 2 tablets at night 360 tablet 4   No current facility-administered medications for this visit.    Medication Side Effects: None  Allergies:  Allergies  Allergen Reactions   Atorvastatin Other (See Comments)    He developed polymyalgia rheumatica was on long-term steroids for 5 years and could not take a statin again    Past Medical History:  Diagnosis Date   Bipolar disorder (HCC)    GERD (gastroesophageal reflux disease)    H/O polymyalgia rheumatica    Pneumonia     Family History  Problem Relation Age of Onset   Hypertension Mother    Obesity Mother    Heart disease Father    Heart attack  Brother    Heart attack Brother     Social History   Socioeconomic History   Marital status: Married    Spouse name: Not on file   Number of children: Not on file   Years of education: Not on file   Highest education level: Not on file  Occupational History   Not on file  Tobacco Use   Smoking status: Never   Smokeless tobacco: Never  Substance and Sexual Activity   Alcohol use: Yes    Comment: occassionally   Drug use: No   Sexual activity: Not on file  Other Topics Concern   Not on file  Social History Narrative   Not on file   Social Determinants of Health   Financial Resource Strain: Not on file  Food Insecurity: No Food Insecurity (03/10/2022)   Received from [facility], [facility]   Hunger Vital Sign    Worried About Running Out of Food in the Last Year: Never true    Ran Out of Food in the Last Year: Never true  Transportation Needs: Not on file  Physical Activity: Not on file  Stress: Not on file  Social Connections: Unknown (01/01/2022)   Received from [facility], [facility]   Social Network    Social Network: Not on file  Intimate Partner Violence: Unknown (01/01/2022)   Received from  [employer], [facility]   HITS    Physically Hurt: Not on file    Insult or Talk Down To: Not on file    Threaten Physical Harm: Not on file    Scream or Curse: Not on file    Past Medical History, Surgical history, Social history, and Family history were reviewed and updated as appropriate.   Please see review of systems for further details on the patient's review from today.   Objective:   Physical Exam:  There were no vitals taken for this visit.  Physical Exam Constitutional:      General: He is not in acute distress.    Appearance: He is well-developed.  Musculoskeletal:        General: No deformity.  Neurological:     Mental Status: He is alert and oriented to person, place, and time.     Coordination: Coordination normal.  Psychiatric:        Attention and Perception: Attention and perception normal. He does not perceive auditory or visual hallucinations.        Mood and Affect: Mood is anxious. Mood is not depressed. Affect is not labile, angry or inappropriate.        Speech: Speech normal. Speech is not slurred.        Behavior: Behavior normal. Behavior is not slowed.        Thought Content: Thought content is not paranoid or delusional. Thought content does not include homicidal or suicidal ideation. Thought content does not include suicidal plan.        Cognition and Memory: Cognition and memory normal.        Judgment: Judgment normal.     Comments: Insight intact     Lab Review:     Component Value Date/Time   NA 134 (L) 05/25/2022 0830   NA 136 01/13/2022 1152   K 4.4 05/25/2022 0830   CL 103 05/25/2022 0830   CO2 25 05/25/2022 0830   GLUCOSE 104 (H) 05/25/2022 0830   BUN 14 05/25/2022 0830   BUN 19 01/13/2022 1152   CREATININE 1.01 05/25/2022 0830  CALCIUM 9.1 05/25/2022 0830   PROT 7.2 05/25/2022 0830   PROT 6.8 06/17/2020 0943   ALBUMIN 4.1 05/25/2022 0830   ALBUMIN 4.7 06/17/2020 0943   AST 23 05/25/2022 0830   ALT 19 05/25/2022 0830    ALKPHOS 66 05/25/2022 0830   BILITOT 0.9 05/25/2022 0830   BILITOT 0.5 06/17/2020 0943   GFRNONAA >60 05/25/2022 0830       Component Value Date/Time   WBC 5.6 05/25/2022 0830   RBC 4.37 05/25/2022 0830   HGB 14.3 05/25/2022 0830   HCT 40.0 05/25/2022 0830   PLT 267 05/25/2022 0830   MCV 91.5 05/25/2022 0830   MCH 32.7 05/25/2022 0830   MCHC 35.8 05/25/2022 0830   RDW 12.4 05/25/2022 0830   LYMPHSABS 1.0 05/25/2022 0830   MONOABS 0.7 05/25/2022 0830   EOSABS 0.7 (H) 05/25/2022 0830   BASOSABS 0.1 05/25/2022 0830    No results found for: "POCLITH", "LITHIUM"   No results found for: "PHENYTOIN", "PHENOBARB", "VALPROATE", "CBMZ"   .res Assessment: Plan:    Vincente was seen today for follow-up.  Diagnoses and all orders for this visit:  Episodic mood disorder (HCC) -     OXcarbazepine (TRILEPTAL) 150 MG tablet; 1 twice daily and 2 tablets at night  Insomnia due to mental condition -     Eszopiclone 3 MG TABS; Take 1 tablet (3 mg total) by mouth at bedtime. Take immediately before bedtime  Performance anxiety    Dx questions chronically between episodic mood disorder, bipolar type II, or atypical depression.  As noted he is tried several different medications and has responded best to Trileptal.  His primary symptom has been irritability and a history of anger outbursts that affected him at both work and home.  These have generally been managed well with the Trileptal.  He has been on Trileptal since 2015.  He is tolerating it well.  He had a bout with probably myalgia rheumatica which lasted for 3 years.  He is resolved he has been able to get off prednisone.  An autoimmune diet by Dr. Phillis Haggis has been very helpful.  He is exercising and that helps both his mood and his physical conditioning. Kept off 25 # he lost a couple years ago.  Generally sleeping well without insists Lunesta but as needed on occasion.  Disc dependence and tolerance and how to do it.     Discussed his gradual slow transition into retirement and the need to develop hobbies for mental health reasons.  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 Supportive therapy on dealing with this. Dealing with transition into retirement.  No med changes indicated .  Option increase Trileptal to 300 BID during the stress.  Continue Lunesta 1- 3 mg HS prn.  Used infrequently. Disc alternative of trazodone.  He thinks he tried it.  Hangover.   Neg sleep study for OSA.    Propranolol prn for perfoming anxiety Not using it enough by his own report.  Some for performance anxiety.    6-9 mos  Meredith Staggers, MD, DFAPA  Please see After Visit Summary for patient specific instructions.  Future Appointments  Date Time Provider Department Center  11/19/2022  4:40 PM GI-315 MR 3 [facility]  12/21/2022  3:30 PM [facility] DX DEXA 1 [facility]     No orders of the defined types were placed in this encounter.   -------------------------------

## 2022-11-19 ENCOUNTER — Ambulatory Visit
Admission: RE | Admit: 2022-11-19 | Discharge: 2022-11-19 | Disposition: A | Discharge status: Home / Self Care | Payer: PPO | Source: Ambulatory Visit | Attending: Family Medicine | Admitting: Family Medicine

## 2022-11-19 DIAGNOSIS — N281 Cyst of kidney, acquired: Secondary | ICD-10-CM | POA: Diagnosis not present

## 2022-11-19 MED ORDER — GADOPICLENOL 0.5 MMOL/ML IV SOLN
8.0000 mL | Freq: Once | INTRAVENOUS | Status: AC | PRN
  Administered 2022-11-19: 8 mL via INTRAVENOUS

## 2022-12-08 DIAGNOSIS — G4710 Hypersomnia, unspecified: Secondary | ICD-10-CM | POA: Diagnosis not present

## 2022-12-08 DIAGNOSIS — G4700 Insomnia, unspecified: Secondary | ICD-10-CM | POA: Diagnosis not present

## 2022-12-08 DIAGNOSIS — G44219 Episodic tension-type headache, not intractable: Secondary | ICD-10-CM | POA: Diagnosis not present

## 2022-12-21 ENCOUNTER — Inpatient Hospital Stay
Admission: RE | Admit: 2022-12-21 | Discharge: 2022-12-21 | Disposition: A | Discharge status: Home / Self Care | Payer: PPO | Source: Ambulatory Visit | Attending: Family Medicine | Admitting: Family Medicine

## 2022-12-21 DIAGNOSIS — Z87310 Personal history of (healed) osteoporosis fracture: Secondary | ICD-10-CM | POA: Diagnosis not present

## 2022-12-21 DIAGNOSIS — M8589 Other specified disorders of bone density and structure, multiple sites: Secondary | ICD-10-CM

## 2022-12-21 DIAGNOSIS — M8588 Other specified disorders of bone density and structure, other site: Secondary | ICD-10-CM | POA: Diagnosis not present

## 2022-12-21 DIAGNOSIS — E785 Hyperlipidemia, unspecified: Secondary | ICD-10-CM | POA: Diagnosis not present

## 2022-12-21 DIAGNOSIS — K219 Gastro-esophageal reflux disease without esophagitis: Secondary | ICD-10-CM | POA: Diagnosis not present

## 2023-01-23 DIAGNOSIS — H00014 Hordeolum externum left upper eyelid: Secondary | ICD-10-CM | POA: Diagnosis not present

## 2023-02-14 DIAGNOSIS — N281 Cyst of kidney, acquired: Secondary | ICD-10-CM

## 2023-02-14 HISTORY — DX: Cyst of kidney, acquired: N28.1

## 2023-02-15 ENCOUNTER — Ambulatory Visit: Payer: PPO | Attending: Cardiology | Admitting: Cardiology

## 2023-02-15 ENCOUNTER — Encounter: Payer: Self-pay | Admitting: Cardiology

## 2023-02-15 VITALS — BP 126/76 | HR 58 | Ht 70.0 in | Wt 206.1 lb

## 2023-02-15 DIAGNOSIS — R931 Abnormal findings on diagnostic imaging of heart and coronary circulation: Secondary | ICD-10-CM

## 2023-02-15 DIAGNOSIS — I7121 Aneurysm of the ascending aorta, without rupture: Secondary | ICD-10-CM | POA: Insufficient documentation

## 2023-02-15 DIAGNOSIS — I10 Essential (primary) hypertension: Secondary | ICD-10-CM | POA: Diagnosis not present

## 2023-02-15 DIAGNOSIS — E782 Mixed hyperlipidemia: Secondary | ICD-10-CM | POA: Insufficient documentation

## 2023-02-15 NOTE — Progress Notes (Signed)
Cardiology Office Note:    Date:  02/15/2023   ID:  Devontai Krol, DOB 21-Nov-1950, MRN [id number]  PCP:  Lupita Raider, MD  Cardiologist:  Garwin Brothers, MD   Referring MD: Lupita Raider, MD    ASSESSMENT:    1. Mixed hyperlipidemia   2. Essential hypertension   3. Mixed dyslipidemia   4. Aneurysm of ascending aorta without rupture (HCC)    PLAN:    In order of problems listed above:  Primary prevention stressed with the patient.  Importance of compliance with diet medication stressed and patient verbalized standing. Cardiac murmur: Echocardiogram will be done to assess murmur heard on auscultation. Essential hypertension: Blood pressure is stable and diet was emphasized.  Lifestyle modification, salt intake issues were discussed. Mixed dyslipidemia: On lipid-lowering medications followed by primary care. Ascending aortic aneurysm: I reviewed the report from last evaluation.  He was previously evaluated by Dr. Dulce Sellar.  I will set him up for a CT scan of his chest to assess this aneurysm.  We will do it in with contrast this time in view of significant size of the aneurysm.  For that reason he will have a Chem-7 today. Patient will be seen in follow-up appointment in 6 months or earlier if the patient has any concerns.    Medication Adjustments/Labs and Tests Ordered: Current medicines are reviewed at length with the patient today.  Concerns regarding medicines are outlined above.  No orders of the defined types were placed in this encounter.  No orders of the defined types were placed in this encounter.    No chief complaint on file.    History of Present Illness:    Johnmichael Helman is a 72 y.o. male.  Patient has past medical history of essential hypertension, mixed dyslipidemia and ascending aortic aneurysm.  He denies any problems at this time and takes care of activities of daily living.  No chest pain orthopnea or PND.  He exercises vigorously without  any symptoms.  He is exercises are mostly aerobic.  At the time of my evaluation, the patient is alert awake oriented and in no distress.  Past Medical History:  Diagnosis Date   Biceps tendinitis of right shoulder 03/29/2021   Bipolar disorder (HCC)    BRONCHITIS 05/21/2009   Qualifier: Diagnosis of   By: Lorenza Chick DOCala Bradford         Cervical radiculopathy 05/18/2016   Chronic pain of right knee 02/20/2021   Chronic pain syndrome 03/12/2016   Current chronic use of systemic steroids 12/23/2015   DDD (degenerative disc disease), lumbosacral 07/19/2012   Drug induced insomnia (HCC) 03/12/2016   GERD (gastroesophageal reflux disease)    H/O polymyalgia rheumatica    Hyperlipidemia LDL goal <70 05/21/2009   Qualifier: Diagnosis of   By: Lorenza Chick DO, Cala Bradford         Impingement syndrome of right shoulder 07/15/2021   Impingement syndrome of shoulder region 06/01/2011   Lumbar radicular pain 02/02/2018   Lumbar spondylosis 02/02/2018   Mild intermittent asthma without complication 08/25/2012   Mixed hyperlipidemia 03/12/2016   Nontraumatic complete tear of right rotator cuff 08/23/2021   Osteopenia 05/21/2015   Perennial allergic rhinitis with seasonal variation 03/12/2016   Pneumonia    Polymyalgia rheumatica (HCC) 03/18/2014   Primary osteoarthritis of right knee 03/18/2021   Renal cyst 02/14/2023   Right hip pain 05/30/2022   Status post surgery 01/11/2022   Trigger finger, acquired 06/30/2022   Trigger finger, left ring finger  10/19/2021    Past Surgical History:  Procedure Laterality Date   back injections     in S 1 joint   COLONOSCOPY WITH PROPOFOL N/A 06/23/2014   Procedure: COLONOSCOPY WITH PROPOFOL;  Surgeon: Charolett Bumpers, MD;  Location: [facility];  Service: Endoscopy;  Laterality: N/A;   HERNIA REPAIR     inguinal   KNEE ARTHROSCOPY      Current Medications: Current Meds  Medication Sig   aspirin EC 81 MG tablet Take 81 mg by mouth daily.   dexlansoprazole  (DEXILANT) 60 MG capsule Take 60 mg by mouth daily.   Eszopiclone 3 MG TABS Take 1 tablet (3 mg total) by mouth at bedtime. Take immediately before bedtime (Patient taking differently: Take 3 mg by mouth as needed (sleep). Take immediately before bedtime)   Evolocumab (REPATHA SURECLICK) 140 MG/ML SOAJ INJECT 140 MG INTO THE SKIN EVERY 14 (FOURTEEN) DAYS.   ezetimibe (ZETIA) 10 MG tablet Take 10 mg by mouth every morning.   loratadine (CLARITIN) 10 MG tablet Take 10 mg by mouth as needed for allergies or rhinitis.   OXcarbazepine (TRILEPTAL) 150 MG tablet 1 twice daily and 2 tablets at night   propranolol (INDERAL) 20 MG tablet TAKE 1 TO 2 TABLETS BY MOUTH TWICE A DAY AS NEEDED FOR ANXIETY     Allergies:   Atorvastatin   Social History   Socioeconomic History   Marital status: Married    Spouse name: Not on file   Number of children: Not on file   Years of education: Not on file   Highest education level: Not on file  Occupational History   Not on file  Tobacco Use   Smoking status: Never   Smokeless tobacco: Never  Substance and Sexual Activity   Alcohol use: Yes    Comment: occassionally   Drug use: No   Sexual activity: Not on file  Other Topics Concern   Not on file  Social History Narrative   Not on file   Social Determinants of Health   Financial Resource Strain: Not on file  Food Insecurity: No Food Insecurity (03/10/2022)   Received from [facility], [facility]   Hunger Vital Sign    Worried About Running Out of Food in the Last Year: Never true    Ran Out of Food in the Last Year: Never true  Transportation Needs: Not on file  Physical Activity: Not on file  Stress: Not on file  Social Connections: Unknown (01/01/2022)   [facility], [facility]   Social Network    Social Network: Not on file     Family History: The patient's family history includes Heart attack in his brother and brother; Heart disease in his father; Hypertension in  his mother; Obesity in his mother.  ROS:   Please see the history of present illness.    All other systems reviewed and are negative.  EKGs/Labs/Other Studies Reviewed:    The following studies were reviewed today: I discussed my findings with the patient at length   Recent Labs: 05/25/2022: ALT 19; BUN 14; Creatinine, Ser 1.01; Hemoglobin 14.3; Platelets 267; Potassium 4.4; Sodium 134  Recent Lipid Panel    Component Value Date/Time   CHOL 142 06/17/2020 0943   TRIG 61 06/17/2020 0943   HDL 72 06/17/2020 0943   CHOLHDL 2.0 06/17/2020 0943   LDLCALC 57 06/17/2020 0943    Physical Exam:    VS:  BP 126/76   Pulse (!) 58  Ht 5\' 10"  (1.778 m)   Wt 206 lb 1.9 oz (93.5 kg)   SpO2 96%   BMI 29.58 kg/m     Wt Readings from Last 3 Encounters:  02/15/23 206 lb 1.9 oz (93.5 kg)  05/25/22 200 lb (90.7 kg)  11/29/21 197 lb (89.4 kg)     GEN: Patient is in no acute distress HEENT: Normal NECK: No JVD; No carotid bruits LYMPHATICS: No lymphadenopathy CARDIAC: Hear sounds regular, 2/6 systolic murmur at the apex. RESPIRATORY:  Clear to auscultation without rales, wheezing or rhonchi  ABDOMEN: Soft, non-tender, non-distended MUSCULOSKELETAL:  No edema; No deformity  SKIN: Warm and dry NEUROLOGIC:  Alert and oriented x 3 PSYCHIATRIC:  Normal affect   Signed, Garwin Brothers, MD  02/15/2023 10:06 AM    [facility]

## 2023-02-15 NOTE — Patient Instructions (Addendum)
Medication Instructions:  Your physician recommends that you continue on your current medications as directed. Please refer to the Current Medication list given to you today.  *If you need a refill on your cardiac medications before your next appointment, please call your pharmacy*   Lab Work: Your physician recommends that you have a BMP today in the office.  If you have labs (blood work) drawn today and your tests are completely normal, you will receive your results only by: MyChart Message (if you have MyChart) OR A paper copy in the mail If you have any lab test that is abnormal or we need to change your treatment, we will call you to review the results.   Testing/Procedures: Your physician has requested that you have an echocardiogram. Echocardiography is a painless test that uses sound waves to create images of your heart. It provides your doctor with information about the size and shape of your heart and how well your heart's chambers and valves are working. This procedure takes approximately one hour. There are no restrictions for this procedure. Please do NOT wear cologne, perfume, aftershave, or lotions (deodorant is allowed). Please arrive 15 minutes prior to your appointment time.   Non-Cardiac CT Angiography (CTA), is a special type of CT scan that uses a computer to produce multi-dimensional views of major blood vessels throughout the body. In CT angiography, a contrast material is injected through an IV to help visualize the blood vessels   Follow-Up: At [facility], you and your health needs are our priority.  As part of our continuing mission to provide you with exceptional heart care, we have created designated Provider Care Teams.  These Care Teams include your primary Cardiologist (physician) and Advanced Practice Providers (APPs -  Physician Assistants and Nurse Practitioners) who all work together to provide you with the care you need, when you need it.  We  recommend signing up for the patient portal called "MyChart".  Sign up information is provided on this After Visit Summary.  MyChart is used to connect with patients for Virtual Visits (Telemedicine).  Patients are able to view lab/test results, encounter notes, upcoming appointments, etc.  Non-urgent messages can be sent to your provider as well.   To learn more about what you can do with MyChart, go to [email or web address].    Your next appointment:   12 month(s)  The format for your next appointment:   In Person  Provider:   Belva Crome, MD   Other Instructions Echocardiogram An echocardiogram is a test that uses sound waves (ultrasound) to produce images of the heart. Images from an echocardiogram can provide important information about: Heart size and shape. The size and thickness and movement of your heart's walls. Heart muscle function and strength. Heart valve function or if you have stenosis. Stenosis is when the heart valves are too narrow. If blood is flowing backward through the heart valves (regurgitation). A tumor or infectious growth around the heart valves. Areas of heart muscle that are not working well because of poor blood flow or injury from a heart attack. Aneurysm detection. An aneurysm is a weak or damaged part of an artery wall. The wall bulges out from the normal force of blood pumping through the body. Tell a health care provider about: Any allergies you have. All medicines you are taking, including vitamins, herbs, eye drops, creams, and over-the-counter medicines. Any blood disorders you have. Any surgeries you have had. Any medical conditions you have. Whether you are pregnant or  may be pregnant. What are the risks? Generally, this is a safe test. However, problems may occur, including an allergic reaction to dye (contrast) that may be used during the test. What happens before the test? No specific preparation is needed. You may eat and drink  normally. What happens during the test? You will take off your clothes from the waist up and put on a hospital gown. Electrodes or electrocardiogram (ECG)patches may be placed on your chest. The electrodes or patches are then connected to a device that monitors your heart rate and rhythm. You will lie down on a table for an ultrasound exam. A gel will be applied to your chest to help sound waves pass through your skin. A handheld device, called a transducer, will be pressed against your chest and moved over your heart. The transducer produces sound waves that travel to your heart and bounce back (or "echo" back) to the transducer. These sound waves will be captured in real-time and changed into images of your heart that can be viewed on a video monitor. The images will be recorded on a computer and reviewed by your health care provider. You may be asked to change positions or hold your breath for a short time. This makes it easier to get different views or better views of your heart. In some cases, you may receive contrast through an IV in one of your veins. This can improve the quality of the pictures from your heart. The procedure may vary among health care providers and hospitals.   What can I expect after the test? You may return to your normal, everyday life, including diet, activities, and medicines, unless your health care provider tells you not to do that. Follow these instructions at home: It is up to you to get the results of your test. Ask your health care provider, or the department that is doing the test, when your results will be ready. Keep all follow-up visits. This is important. Summary An echocardiogram is a test that uses sound waves (ultrasound) to produce images of the heart. Images from an echocardiogram can provide important information about the size and shape of your heart, heart muscle function, heart valve function, and other possible heart problems. You do not need to do  anything to prepare before this test. You may eat and drink normally. After the echocardiogram is completed, you may return to your normal, everyday life, unless your health care provider tells you not to do that. This information is not intended to replace advice given to you by your health care provider. Make sure you discuss any questions you have with your health care provider. Document Revised: 10/29/2019 Document Reviewed: 10/29/2019 Elsevier Patient Education  2021 Elsevier Inc.   Important Information About Sugar

## 2023-02-16 ENCOUNTER — Telehealth: Payer: Self-pay | Admitting: Cardiothoracic Surgery

## 2023-02-16 NOTE — Telephone Encounter (Signed)
Called by [facility] re K of 6.1 drawn during a routine office visit the day before. Reviewed EMR, not on any meds that should increase K level. Other labs ok Prior K level hovering in the high 4 to 5.0 range.  Called patient, who did the [place] run this AM, feels well. No symptoms. No palpitations He has a routine physical next Tuesday with PCP. Instructed him to come into ER if any symptoms, otherwise he should get a recheck of his K early next week.  Confirmed he understood instructions  Yu-Ping Regino Schultze

## 2023-02-20 ENCOUNTER — Other Ambulatory Visit: Payer: Self-pay | Admitting: Cardiology

## 2023-02-20 DIAGNOSIS — E782 Mixed hyperlipidemia: Secondary | ICD-10-CM

## 2023-02-20 DIAGNOSIS — R011 Cardiac murmur, unspecified: Secondary | ICD-10-CM

## 2023-02-20 DIAGNOSIS — R931 Abnormal findings on diagnostic imaging of heart and coronary circulation: Secondary | ICD-10-CM

## 2023-02-20 DIAGNOSIS — I10 Essential (primary) hypertension: Secondary | ICD-10-CM

## 2023-02-20 DIAGNOSIS — I7121 Aneurysm of the ascending aorta, without rupture: Secondary | ICD-10-CM

## 2023-02-21 DIAGNOSIS — E782 Mixed hyperlipidemia: Secondary | ICD-10-CM | POA: Diagnosis not present

## 2023-02-21 DIAGNOSIS — N281 Cyst of kidney, acquired: Secondary | ICD-10-CM | POA: Diagnosis not present

## 2023-02-21 DIAGNOSIS — I712 Thoracic aortic aneurysm, without rupture, unspecified: Secondary | ICD-10-CM | POA: Diagnosis not present

## 2023-02-21 DIAGNOSIS — F319 Bipolar disorder, unspecified: Secondary | ICD-10-CM | POA: Diagnosis not present

## 2023-02-21 DIAGNOSIS — Z125 Encounter for screening for malignant neoplasm of prostate: Secondary | ICD-10-CM | POA: Diagnosis not present

## 2023-02-21 DIAGNOSIS — Z0000 Encounter for general adult medical examination without abnormal findings: Secondary | ICD-10-CM | POA: Diagnosis not present

## 2023-02-21 DIAGNOSIS — I2510 Atherosclerotic heart disease of native coronary artery without angina pectoris: Secondary | ICD-10-CM | POA: Diagnosis not present

## 2023-02-21 DIAGNOSIS — Z9181 History of falling: Secondary | ICD-10-CM | POA: Diagnosis not present

## 2023-02-21 DIAGNOSIS — M353 Polymyalgia rheumatica: Secondary | ICD-10-CM | POA: Diagnosis not present

## 2023-02-21 DIAGNOSIS — M8580 Other specified disorders of bone density and structure, unspecified site: Secondary | ICD-10-CM | POA: Diagnosis not present

## 2023-02-21 DIAGNOSIS — K219 Gastro-esophageal reflux disease without esophagitis: Secondary | ICD-10-CM | POA: Diagnosis not present

## 2023-02-21 LAB — LAB REPORT - SCANNED: EGFR: 76

## 2023-02-22 ENCOUNTER — Ambulatory Visit (HOSPITAL_BASED_OUTPATIENT_CLINIC_OR_DEPARTMENT_OTHER)
Admission: RE | Admit: 2023-02-22 | Discharge: 2023-02-22 | Disposition: A | Discharge status: Home / Self Care | Payer: PPO | Source: Ambulatory Visit | Attending: Cardiology | Admitting: Cardiology

## 2023-02-22 DIAGNOSIS — R011 Cardiac murmur, unspecified: Secondary | ICD-10-CM | POA: Insufficient documentation

## 2023-02-22 DIAGNOSIS — I7121 Aneurysm of the ascending aorta, without rupture: Secondary | ICD-10-CM | POA: Diagnosis not present

## 2023-02-22 LAB — ECHOCARDIOGRAM COMPLETE
AR max vel: 2.54 cm2
AV Area VTI: 2.53 cm2
AV Area mean vel: 2.55 cm2
AV Mean grad: 6 mm[Hg]
AV Peak grad: 11.6 mm[Hg]
AV Vena cont: 0.4 cm
Ao pk vel: 1.7 m/s
Area-P 1/2: 2.19 cm2
Calc EF: 63.5 %
MV M vel: 1.94 m/s
MV Peak grad: 15.1 mm[Hg]
P 1/2 time: 870 ms
S' Lateral: 2.35 cm
Single Plane A2C EF: 62.5 %
Single Plane A4C EF: 66.5 %

## 2023-03-01 ENCOUNTER — Telehealth: Payer: Self-pay | Admitting: Cardiology

## 2023-03-01 LAB — BASIC METABOLIC PANEL
BUN/Creatinine Ratio: 22 (ref 10–24)
BUN: 22 mg/dL (ref 8–27)
CO2: 23 mmol/L (ref 20–29)
Calcium: 10 mg/dL (ref 8.6–10.2)
Chloride: 101 mmol/L (ref 96–106)
Creatinine, Ser: 1.02 mg/dL (ref 0.76–1.27)
Glucose: 86 mg/dL (ref 70–99)
Potassium: 6.1 mmol/L (ref 3.5–5.2)
Sodium: 139 mmol/L (ref 134–144)
eGFR: 79 mL/min/{1.73_m2} (ref >59)

## 2023-03-01 NOTE — Telephone Encounter (Signed)
Reported potassium 6.1 lab was already taken care of and results were normal at [facility].

## 2023-03-01 NOTE — Telephone Encounter (Signed)
Caller Clydie Braun) is calling in critical lab results.

## 2023-04-03 ENCOUNTER — Ambulatory Visit (HOSPITAL_BASED_OUTPATIENT_CLINIC_OR_DEPARTMENT_OTHER)
Admission: RE | Admit: 2023-04-03 | Discharge: 2023-04-03 | Disposition: A | Discharge status: Home / Self Care | Payer: PPO | Source: Ambulatory Visit | Attending: Cardiology | Admitting: Cardiology

## 2023-04-03 ENCOUNTER — Encounter (HOSPITAL_BASED_OUTPATIENT_CLINIC_OR_DEPARTMENT_OTHER): Payer: Self-pay

## 2023-04-03 DIAGNOSIS — I7121 Aneurysm of the ascending aorta, without rupture: Secondary | ICD-10-CM | POA: Insufficient documentation

## 2023-04-03 MED ORDER — IOHEXOL 350 MG/ML SOLN
100.0000 mL | Freq: Once | INTRAVENOUS | Status: AC | PRN
  Administered 2023-04-03: 100 mL via INTRAVENOUS

## 2023-04-04 ENCOUNTER — Encounter: Payer: Self-pay | Admitting: Family Medicine

## 2023-04-05 ENCOUNTER — Other Ambulatory Visit: Payer: Self-pay | Admitting: Cardiology

## 2023-04-12 ENCOUNTER — Other Ambulatory Visit (HOSPITAL_COMMUNITY): Payer: Self-pay

## 2023-04-12 ENCOUNTER — Telehealth: Payer: Self-pay

## 2023-04-12 ENCOUNTER — Telehealth: Payer: Self-pay | Admitting: Pharmacy Technician

## 2023-04-12 NOTE — Telephone Encounter (Signed)
Pharmacy Patient Advocate Encounter   Received notification from Pt Calls Messages that prior authorization for repatha is required/requested.   Insurance verification completed.   The patient is insured through [facility] ADVANTAGE/RX ADVANCE .   Per test claim: PA required; PA submitted to above mentioned insurance via CoverMyMeds Key/confirmation #/EOC [id number] Status is pending

## 2023-04-12 NOTE — Telephone Encounter (Signed)
 Marland Kitchen

## 2023-04-13 ENCOUNTER — Telehealth: Payer: Self-pay | Admitting: Pharmacy Technician

## 2023-04-13 ENCOUNTER — Other Ambulatory Visit (HOSPITAL_COMMUNITY): Payer: Self-pay

## 2023-04-13 NOTE — Telephone Encounter (Signed)
Now it is saying too soon until 06/07/23 because last got in 04/05/23 at [facility]. I also spoke with [facility]althte[facility] and they confirmed this

## 2023-04-13 NOTE — Telephone Encounter (Signed)
Received fax insurance requires more information, I called and faxed additional information

## 2023-04-14 NOTE — Telephone Encounter (Signed)
Patient notified of the following.

## 2023-04-26 ENCOUNTER — Encounter: Payer: Self-pay | Admitting: Cardiology

## 2023-04-26 ENCOUNTER — Other Ambulatory Visit: Payer: Self-pay | Admitting: Medical Genetics

## 2023-04-26 DIAGNOSIS — I7121 Aneurysm of the ascending aorta, without rupture: Secondary | ICD-10-CM

## 2023-05-03 NOTE — Progress Notes (Addendum)
 [address]       [place] [address]             [phone]   PCP is Lupita Raider, MD Referring Provider is Lupita Raider, MD  Chief Complaint: Ascending thoracic aortic aneurysm  HPI: This is a 73 year old male, who is an Research scientist (life sciences),  with a past medical history of hypertension, chronic pain syndrome, mixed hyperlipidemia, polymyalgia rheumatica, DDD, GERD, chronic use of steroids who was found on cardiac CT (September 2019) to have a 45 mm ATAA.   He presents today to to establish surveillance of the ascending thoracic aortic aneurysm. Patient denies chest pain, pressure, or tightness. He is very active as he rides motorcycles, goes to the gym 4-5 times per week, and walks several times per week. He does not lift heavy weights and was encouraged to avoid because of ATAA.    Past Medical History:  Diagnosis Date   Biceps tendinitis of right shoulder 03/29/2021   Bipolar disorder (HCC)    BRONCHITIS 05/21/2009   Qualifier: Diagnosis of   By: Lorenza Chick DOCala Bradford         Cervical radiculopathy 05/18/2016   Chronic pain of right knee 02/20/2021   Chronic pain syndrome 03/12/2016   Current chronic use of systemic steroids 12/23/2015   DDD (degenerative disc disease), lumbosacral 07/19/2012   Drug induced insomnia (HCC) 03/12/2016   GERD (gastroesophageal reflux disease)    H/O polymyalgia rheumatica    Hyperlipidemia LDL goal <70 05/21/2009   Qualifier: Diagnosis of   By: Lorenza Chick DO, Cala Bradford         Impingement syndrome of right shoulder 07/15/2021   Impingement syndrome of shoulder region 06/01/2011   Lumbar radicular pain 02/02/2018   Lumbar spondylosis 02/02/2018   Mild intermittent asthma without complication 08/25/2012   Mixed hyperlipidemia 03/12/2016   Nontraumatic complete tear of right rotator cuff 08/23/2021   Osteopenia 05/21/2015   Perennial allergic rhinitis with seasonal variation 03/12/2016   Pneumonia    Polymyalgia  rheumatica (HCC) 03/18/2014   Primary osteoarthritis of right knee 03/18/2021   Renal cyst 02/14/2023   Right hip pain 05/30/2022   Status post surgery 01/11/2022   Trigger finger, acquired 06/30/2022   Trigger finger, left ring finger 10/19/2021    Past Surgical History:  Procedure Laterality Date   back injections     in S 1 joint   COLONOSCOPY WITH PROPOFOL N/A 06/23/2014   Procedure: COLONOSCOPY WITH PROPOFOL;  Surgeon: Charolett Bumpers, MD;  Location: [facility];  Service: Endoscopy;  Laterality: N/A;   HERNIA REPAIR     inguinal   KNEE ARTHROSCOPY      Family History  Problem Relation Age of Onset   Hypertension Mother    Obesity Mother    Heart disease Father    Heart attack Brother    Heart attack Brother     Social History Social History   Tobacco Use   Smoking status: Never   Smokeless tobacco: Never  Substance Use Topics   Alcohol use: Yes    Comment: occassionally   Drug use: No  Daily scotch  Current Outpatient Medications  Medication Sig Dispense Refill   aspirin EC 81 MG tablet Take 81 mg by mouth daily.     dexlansoprazole (DEXILANT) 60 MG capsule Take 60 mg by mouth daily.     Eszopiclone 3 MG TABS Take 1 tablet (3 mg total) by mouth at bedtime. Take immediately before bedtime (  Patient taking differently: Take 3 mg by mouth as needed (sleep). Take immediately before bedtime) 30 tablet 2   Evolocumab (REPATHA SURECLICK) 140 MG/ML SOAJ Inject 140 mg into the skin every 14 (fourteen) days. 6 mL 2   ezetimibe (ZETIA) 10 MG tablet Take 10 mg by mouth every morning.     loratadine (CLARITIN) 10 MG tablet Take 10 mg by mouth as needed for allergies or rhinitis.     OXcarbazepine (TRILEPTAL) 150 MG tablet 1 twice daily and 2 tablets at night 360 tablet 4   propranolol (INDERAL) 20 MG tablet TAKE 1 TO 2 TABLETS BY MOUTH TWICE A DAY AS NEEDED FOR ANXIETY 360 tablet 1   Allergies  Allergen Reactions   Atorvastatin Other (See Comments)    He developed  polymyalgia rheumatica was on long-term steroids for 5 years and could not take a statin again    Review of Systems Chest Pain Klaus.Mock  ] Resting SOB Klaus.Mock ] Exertional SOB [ N ]  Pedal Edema [ once in awhile mild LE ] Syncope [ N ]  General Review of Systems: [Y] = yes [ N]=no  Consitutional:   nausea Klaus.Mock ];  fever [ N];  Resp: cough [ N];  hemoptysis[ N];  GI: vomiting[ N]; melena[ N]; hematochezia [N];  OZ:HYQMVHQIO[N ]; Heme/Lymph: anemia[ N];  Neuro: TIA[ N];stroke[N ];  seizures[N ];  Endocrine: diabetes[ N];   Vital Signs: Vitals:   05/17/23 1433  BP: (!) 133/94  Pulse: 69  Resp: 18  SpO2: 97%     Physical Exam: CV-RRR, no murmur Neck-No carotid bruit Pulmonary-Clear to auscultation bilaterally Abdomen-Soft, non tender, bowel sounds present Extremities-No LE edema Neurologic-Grossly intact without focal deficit  Diagnostic Tests: Narrative & Impression  CLINICAL DATA:  Thoracic aortic aneurysm   EXAM: CT ANGIOGRAPHY CHEST WITH CONTRAST   TECHNIQUE: Multidetector CT imaging of the chest was performed using the standard protocol during bolus administration of intravenous contrast. Multiplanar CT image reconstructions and MIPs were obtained to evaluate the vascular anatomy.   RADIATION DOSE REDUCTION: This exam was performed according to the departmental dose-optimization program which includes automated exposure control, adjustment of the mA and/or kV according to patient size and/or use of iterative reconstruction technique.   CONTRAST:  OMNIPAQUE IOHEXOL 350 MG/ML SOLN   COMPARISON:  01/17/2022   FINDINGS: Cardiovascular: 4.5 cm ascending thoracic aortic aneurysm is again noted. No evidence of thoracic aortic dissection. Stable atherosclerosis of the aorta.   Mild cardiomegaly without pericardial effusion. There is calcification of the aortic valve. Stable atherosclerosis of the coronary vasculature.   There is technically adequate opacification of  the pulmonary vasculature. No filling defects or pulmonary emboli. Stable prominence of the main pulmonary arteries may reflect underlying pulmonary arterial hypertension.   Mediastinum/Nodes: No enlarged mediastinal, hilar, or axillary lymph nodes. Thyroid gland, trachea, and esophagus demonstrate no significant findings.   Lungs/Pleura: No acute airspace disease, effusion, or pneumothorax. Central airways are patent. Stable calcified granulomas.   Upper Abdomen: No acute abnormality.   Musculoskeletal: No acute or destructive bony abnormalities. Reconstructed images demonstrate no additional findings.   Review of the MIP images confirms the above findings.   IMPRESSION: 1. Stable 4.5 cm ascending thoracic aortic aneurysm. Recommend semi-annual imaging followup by CTA or MRA and referral to cardiothoracic surgery if not already obtained. This recommendation follows 2010 ACCF/AHA/AATS/ACR/ASA/SCA/SCAI/SIR/STS/SVM Guidelines for the Diagnosis and Management of Patients With Thoracic Aortic Disease. Circulation. 2010; 121: G295-M841. Aortic aneurysm NOS (ICD10-I71.9) 2. Stable dilated main  pulmonary arteries consistent with pulmonary arterial hypertension. 3. Aortic Atherosclerosis (ICD10-I70.0). Coronary artery atherosclerosis.     Electronically Signed   By: Sharlet Salina M.D.   On: 04/07/2023 21:22    Impression and Plan: CTA with a 4.5 cm ascending aortic aneurysm.  Echocardiogram done December 2024 showed a a normal aortic valve with mild aortic regurgitation.  We discussed the natural history and and risk factors for growth of ascending aortic aneurysms.  We covered the importance of smoking cessation (does not pertain to him as does not smoke), tight blood pressure control, refraining from lifting heavy objects, and avoiding fluoroquinolones.  He does not require surgical intervention on his ATAA at this time. The patient is aware of signs and symptoms of aortic  dissection and when to present to the emergency department.  We will continue surveillance and a repeat CTA was ordered for 6 months.   Ardelle Balls, PA-C [facility] [phone]

## 2023-05-17 ENCOUNTER — Institutional Professional Consult (permissible substitution): Payer: PPO | Admitting: Physician Assistant

## 2023-05-17 VITALS — BP 133/94 | HR 69 | Resp 18 | Ht 70.0 in | Wt 197.0 lb

## 2023-05-17 DIAGNOSIS — I7121 Aneurysm of the ascending aorta, without rupture: Secondary | ICD-10-CM

## 2023-05-17 NOTE — Patient Instructions (Addendum)
 Risk Modification in those with ascending thoracic aortic aneurysm:  Continue good control of blood pressure (prefer SBP 130/80 or less)  2. Avoid fluoroquinolone antibiotics (I.e Ciprofloxacin, Avelox, Levofloxacin, Ofloxacin)  3.  Use of statin (to decrease cardiovascular risk)-He is on Zetia and Repatha. Lipid Profile done March 2022: Total cholesterol 142, Triglycerides 61, HDL 72, and LDL 57.   4.  Exercise and activity limitations is individualized, but in general, contact sports are to be avoided and one should avoid heavy lifting (defined as half of ideal body weight) and exercises involving sustained Valsalva maneuver.  5. Counseling for those suspected of having genetically mediated disease. First-degree relatives of those with TAA disease should be screened as well as those who have a connective tissue disease (I.e with Marfan syndrome, Ehlers-Danlos syndrome, and Loeys-Dietz syndrome) or a bicuspid aortic valve,have an increased risk for  complications related to TAA. Patient with no family history of connective tissue disease. Echocardiogram done December 2024 showed a a normal aortic valve with mild aortic regurgitation.  6. He has no history of tobacco abuse.

## 2023-07-04 ENCOUNTER — Other Ambulatory Visit

## 2023-07-04 DIAGNOSIS — Z006 Encounter for examination for normal comparison and control in clinical research program: Secondary | ICD-10-CM

## 2023-07-18 DIAGNOSIS — L821 Other seborrheic keratosis: Secondary | ICD-10-CM | POA: Diagnosis not present

## 2023-07-18 DIAGNOSIS — L308 Other specified dermatitis: Secondary | ICD-10-CM | POA: Diagnosis not present

## 2023-07-18 DIAGNOSIS — L570 Actinic keratosis: Secondary | ICD-10-CM | POA: Diagnosis not present

## 2023-07-22 LAB — GENECONNECT MOLECULAR SCREEN

## 2023-07-25 ENCOUNTER — Telehealth: Payer: Self-pay | Admitting: Medical Genetics

## 2023-07-25 NOTE — Telephone Encounter (Signed)
 [place] [facility]  07/25/2023 11:35 AM  Confirmed I was speaking with Cameron Stout [facility] [phone] by using name and DOB. Informed participant the reason for this call is to follow-up on a recent buccal sample the participant provided at one of the [facility] lab locations. Informed participant the test was not able to be completed with this sample and apologized for the inconvenience. Participant was requested to provide a new sample at one of our participating labs at no cost so that participant can continue participation and receive test results. Informed participant they do not need to be fasting and if there are other samples that need to be drawn, they can be done at the same visit. Participant has not had a blood transfusion or blood product in the last 30 days. Participant agreed to provide another sample. Participant was provided the [employer] program website to learn why this may have happened. Participant was thanked for their time and continued support of the above study.    Jordyn Pennstrom, BS [place]  [facility] Clinical Research Specialist II Direct Dial: [phone]  Fax: [phone]

## 2023-07-28 ENCOUNTER — Other Ambulatory Visit: Payer: Self-pay | Admitting: Medical Genetics

## 2023-07-28 DIAGNOSIS — Z006 Encounter for examination for normal comparison and control in clinical research program: Secondary | ICD-10-CM

## 2023-07-28 NOTE — Progress Notes (Signed)
 Initial result was a TNP. New order requested. Confirmed consent on file.

## 2023-08-02 ENCOUNTER — Ambulatory Visit: Payer: PPO | Admitting: Psychiatry

## 2023-08-10 ENCOUNTER — Encounter: Payer: Self-pay | Admitting: Psychiatry

## 2023-08-10 ENCOUNTER — Ambulatory Visit (INDEPENDENT_AMBULATORY_CARE_PROVIDER_SITE_OTHER): Payer: PPO | Admitting: Psychiatry

## 2023-08-10 DIAGNOSIS — F39 Unspecified mood [affective] disorder: Secondary | ICD-10-CM

## 2023-08-10 DIAGNOSIS — F5105 Insomnia due to other mental disorder: Secondary | ICD-10-CM

## 2023-08-10 MED ORDER — OXCARBAZEPINE 150 MG PO TABS: ORAL_TABLET | ORAL | 4 refills | Status: AC

## 2023-08-10 MED ORDER — ESZOPICLONE 3 MG PO TABS: 3.0000 mg | ORAL_TABLET | Freq: Every day | ORAL | 3 refills | Status: DC

## 2023-08-10 NOTE — Progress Notes (Signed)
 Cameron Stout [id number] [birth year] 73 y.o.  Subjective:   Patient ID:  Cameron Stout is a 73 y.o. (DOB Jul 30, 1950) male.  Chief Complaint:  Chief Complaint  Patient presents with   Follow-up   Depression   Anxiety   Sleeping Problem    HPI Cameron Stout presents to the office today for follow-up of episodic mood disorder.  seen Sept 2020.  No meds were changed.  He remained on Trileptal  150 mg tablet one half every morning and 2 tablets every afternoon, Lunesta  2 mg nightly as needed insomnia  05/24/19 appt with the following noted: Forcing to partner or join big group and it's stresful and trying to decide what to do.  Affects his sleep.  Parnter not handling it well.  Lunesta  helps.   Disc retirement and need for something to do when he cuts back work.   He plans to gradually cut back and not stop. Overall OK.  During high stress March-May increased Trileptal  2 tab BID and then able to cut it back.  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  Big decision.  Plans to only work 3 days/week.  Plans to continue that for 6 mos and reassess. Busy with side businesses rental houses.   Mood has been stable.  Stress can affect sleep and some irritability at times. Still rides motorcycle for fun.  No complaints with meds. 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  Some patients upset over his retirement.  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   Remodeling house.  Staying busy mostly.  Exercising and going to gym.  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   Taking bike trips now with a group.  Has some trips.  Mood is better now that transition is further down the road.  Mood is fine.  Tends to be emotional easily.  12/09/20 appt noted: Slowed down in work but 3 days per week.  Took yearly  trip to [place].  D moving back from [place]. Going to [place].  Doing fine.  No  complaints. Upcoming speaking situations generating anxiety. Patient reports stable mood and denies depressed but mild manageable irritable moods.  Patient denies any recent difficulty with anxiety.  Patient denies difficulty with sleep initiation or maintenance. Rare Lunesta . Average 6 hours sleep is good.  Sleep is good generally unless occ pain. Denies appetite disturbance.  Patient reports that energy and motivation have been good.  Patient denies any difficulty with concentration. Patient denies any suicidal ideation. Plan: No med changes indicated .  Option increase Trileptal  to 300 BID during the stress. Propranolol  prn for perfoming anxiety  06/02/2021 appt noted: Doing fine.  Slowing down at work is tricky and some days anxious delaing with it.  Up to him when to stop and plans to stop end of the year.   Can't be as generous as he used to be now that corporate take over.   Patient reports stable mood and denies depressed or irritable moods.   Patient denies difficulty with sleep initiation or maintenance. Denies appetite disturbance.  Patient reports that energy and motivation have been good.  Patient denies any difficulty with concentration.  Patient denies any suicidal ideation. Plan no changes  01/17/22 appt noted: Good overall.  Still working on retirement plans. Shoulder surgery several weeks ago. Not ready to retire yet.  Scared to not be busy enough.  Still issues with sleep bc hard to sleep with shoulder surgery.   Getting follow up of abd aorta.  Can still walk several miles. Was in the gym before shoulder surgery. Mood has been good.  Anxiety got married and he used propranolol  for public speaking.  Got through the wedding fine.  D got married.     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  Will likely have to reevaluate whether to continue PT work or not.  Not sure what will happen.   Otherwise mood is good. Creates some anxiety.   No sig hobby other  than motorcycles.  In rides for 40 years.   Uses Lunesta  prn.  Usually only 1.5 mg HS.  Otherwise gets hangover.   Still exerise.  Staying busy. Patient reports stable mood and denies depressed or irritable moods.  Patient denies any recent difficulty with anxiety.  Denies appetite disturbance.  Patient reports that energy and motivation have been good.  Patient denies any difficulty with concentration.  Patient denies any suicidal ideation.  08/10/23 appt noted:  Working on diet.   Getting on used to being retired but will still do some unpaid work at times.   Med: oxcarb 150 BID  and & 300 HS, propranolol  10  mg 1-2 tab BID prn anxiety, Lunesta  3 HS prn Manages Air BNB and helps with sister's yard.   Travelling with Cameron Stout and yesterday just back from [place].  First time.   Mood good.  Less stress with retirement. No px with meds.  No new sx.  Good mental health.  Fights dep with health eating and exercise gym 3-4 days per week.   Sleep comes and goes.  Usually good with some EMA Sister alcoholic and recluse but sees her weekly.   Has another twin sister.    Past Psychiatric Medication Trials:  Lithium 750, Depakote, Equetro, Trileptal  since 2015,  Lunesta  3 mg hangover,  ProSom Trazodone hangover. propranolol , fluoxetine, sertraline,  Under care of this practice since 2007  Review of Systems:  Review of Systems  Constitutional:  Negative for fatigue.  Cardiovascular:  Negative for palpitations.  Musculoskeletal:  Positive for arthralgias.  Neurological:  Negative for dizziness, tremors and weakness.  Psychiatric/Behavioral:  Negative for agitation. The patient is not nervous/anxious.     Medications: I have reviewed the patient's current medications.  Current Outpatient Medications  Medication Sig Dispense Refill   aspirin EC 81 MG tablet Take 81 mg by mouth daily.     dexlansoprazole (DEXILANT) 60 MG capsule Take 60 mg by mouth daily.     Eszopiclone  3 MG TABS Take 1 tablet  (3 mg total) by mouth at bedtime. Take immediately before bedtime (Patient taking differently: Take 3 mg by mouth as needed (sleep). Take immediately before bedtime) 30 tablet 2   Evolocumab  (REPATHA  SURECLICK) 140 MG/ML SOAJ Inject 140 mg into the skin every 14 (fourteen) days. 6 mL 2   ezetimibe (ZETIA) 10 MG tablet Take 10 mg by mouth every morning.     loratadine (CLARITIN) 10 MG tablet Take 10 mg by mouth as needed for allergies or rhinitis.     OXcarbazepine  (TRILEPTAL ) 150 MG tablet 1 twice daily and 2 tablets at night 360 tablet 4   propranolol  (INDERAL ) 20 MG tablet TAKE 1 TO 2 TABLETS BY MOUTH TWICE A DAY AS NEEDED FOR ANXIETY 360 tablet 1   No current facility-administered medications for this visit.    Medication Side Effects: None  Allergies:  Allergies  Allergen Reactions   Atorvastatin Other (See Comments)    He developed polymyalgia rheumatica was on long-term steroids for 5 years and could not take a statin again   Quinolones     Patient with ATAA (ascending thoracic aortic aneurysm) and so should avoid this class of antibiotic    Past Medical History:  Diagnosis Date   Biceps tendinitis of right shoulder 03/29/2021   Bipolar disorder (HCC)    BRONCHITIS 05/21/2009   Qualifier: Diagnosis of   By: Merita Staples         Cervical radiculopathy 05/18/2016   Chronic pain of right knee 02/20/2021   Chronic pain syndrome 03/12/2016   Current chronic use of systemic steroids 12/23/2015   DDD (degenerative disc disease), lumbosacral 07/19/2012   Drug induced insomnia (HCC) 03/12/2016   GERD (gastroesophageal reflux disease)    H/O polymyalgia rheumatica    Hyperlipidemia LDL goal <70 05/21/2009   Qualifier: Diagnosis of   By: Kathlee Pap DO, Jullie Oiler         Impingement syndrome of right shoulder 07/15/2021   Impingement syndrome of shoulder region 06/01/2011   Lumbar radicular pain 02/02/2018   Lumbar spondylosis 02/02/2018   Mild intermittent asthma without  complication 08/25/2012   Mixed hyperlipidemia 03/12/2016   Nontraumatic complete tear of right rotator cuff 08/23/2021   Osteopenia 05/21/2015   Perennial allergic rhinitis with seasonal variation 03/12/2016   Pneumonia    Polymyalgia rheumatica (HCC) 03/18/2014   Primary osteoarthritis of right knee 03/18/2021   Renal cyst 02/14/2023   Right hip pain 05/30/2022   Status post surgery 01/11/2022   Trigger finger, acquired 06/30/2022   Trigger finger, left ring finger 10/19/2021    Family History  Problem Relation Age of Onset   Hypertension Mother    Obesity Mother    Heart disease Father    Heart attack Brother    Heart attack Brother     Social History   Socioeconomic History   Marital status: Married    Spouse name: Not on file   Number of children: Not on file   Years of education: Not on file   Highest education level: Not on file  Occupational History   Not on file  Tobacco Use   Smoking status: Never   Smokeless tobacco: Never  Substance and Sexual Activity   Alcohol use: Yes    Comment: occassionally   Drug use: No   Sexual activity: Not on file  Other Topics Concern   Not on file  Social History Narrative   Not on file   Social Drivers of Health   Financial Resource Strain: Not on file  Food Insecurity: No Food Insecurity (03/10/2022)   Received from [facility], [facility]   Hunger Vital Sign    Worried About Running Out of Food in the Last Year: Never true    Ran Out of Food in the Last Year: Never true  Transportation Needs: Not on file  Physical Activity: Not on file  Stress: Not on file  Social Connections: Unknown (01/01/2022)   [facility] from [facility], [facility]   Social Network    Social Network: Not on file  Intimate Partner Violence: Unknown (01/01/2022)   Received from [employer], [facility]   HITS    Physically Hurt: Not on file    Insult or Talk Down To: Not on file    Threaten Physical Harm: Not on file     Scream or Curse: Not on file  Past Medical History, Surgical history, Social history, and Family history were reviewed and updated as appropriate.   Please see review of systems for further details on the patient's review from today.   Objective:   Physical Exam:  There were no vitals taken for this visit.  Physical Exam Constitutional:      General: He is not in acute distress.    Appearance: He is well-developed.  Musculoskeletal:        General: No deformity.  Neurological:     Mental Status: He is alert and oriented to person, place, and time.     Coordination: Coordination normal.  Psychiatric:        Attention and Perception: Attention and perception normal. He does not perceive auditory or visual hallucinations.        Mood and Affect: Mood is anxious. Mood is not depressed. Affect is not labile, angry or inappropriate.        Speech: Speech normal. Speech is not slurred.        Behavior: Behavior normal. Behavior is not slowed.        Thought Content: Thought content is not paranoid or delusional. Thought content does not include homicidal or suicidal ideation. Thought content does not include suicidal plan.        Cognition and Memory: Cognition and memory normal.        Judgment: Judgment normal.     Comments: Insight intact     Lab Review:     Component Value Date/Time   NA 139 02/15/2023 1022   K 6.1 (HH) 02/15/2023 1022   CL 101 02/15/2023 1022   CO2 23 02/15/2023 1022   GLUCOSE 86 02/15/2023 1022   GLUCOSE 104 (H) 05/25/2022 0830   BUN 22 02/15/2023 1022   CREATININE 1.02 02/15/2023 1022   CALCIUM 10.0 02/15/2023 1022   PROT 7.2 05/25/2022 0830   PROT 6.8 06/17/2020 0943   ALBUMIN 4.1 05/25/2022 0830   ALBUMIN 4.7 06/17/2020 0943   AST 23 05/25/2022 0830   ALT 19 05/25/2022 0830   ALKPHOS 66 05/25/2022 0830   BILITOT 0.9 05/25/2022 0830   BILITOT 0.5 06/17/2020 0943   GFRNONAA >60 05/25/2022 0830       Component Value Date/Time   WBC 5.6  05/25/2022 0830   RBC 4.37 05/25/2022 0830   HGB 14.3 05/25/2022 0830   HCT 40.0 05/25/2022 0830   PLT 267 05/25/2022 0830   MCV 91.5 05/25/2022 0830   MCH 32.7 05/25/2022 0830   MCHC 35.8 05/25/2022 0830   RDW 12.4 05/25/2022 0830   LYMPHSABS 1.0 05/25/2022 0830   MONOABS 0.7 05/25/2022 0830   EOSABS 0.7 (H) 05/25/2022 0830   BASOSABS 0.1 05/25/2022 0830    No results found for: "POCLITH", "LITHIUM"   No results found for: "PHENYTOIN", "PHENOBARB", "VALPROATE", "CBMZ"   .res Assessment: Plan:    Cameron Stout was seen today for follow-up, depression, anxiety and sleeping problem.  Diagnoses and all orders for this visit:  Episodic mood disorder (HCC)  Insomnia due to mental condition    Dx questions chronically between episodic mood disorder, bipolar type II, or atypical depression.  As noted he is tried several different medications and has responded best to Trileptal .  His primary symptom has been irritability and a history of anger outbursts that affected him at both work and home.  These have generally been managed well with the Trileptal .  He has been on Trileptal  since 2015.  He is tolerating it well.  He  had a bout with probably myalgia rheumatica which lasted for 3 years.  He is resolved he has been able to get off prednisone.  An autoimmune diet by Dr. Lear Prosper has been very helpful.  He is exercising and that helps both his mood and his physical conditioning. Kept off 25 # he lost a couple years ago.  Generally sleeping well without insists Lunesta  but as needed on occasion.  Disc dependence and tolerance and how to do it.    Discussed his gradual slow transition into retirement and the need to develop hobbies for mental health reasons.  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 Supportive therapy on dealing with this. Dealing with transition into retirement.  No med changes indicated .  Option increase Trileptal  to 300 BID  during the stress.  Continue Lunesta  1- 3 mg HS prn.  Used infrequently. Disc alternative of trazodone.  He thinks he tried it.  Hangover.   Neg sleep study for OSA.    Propranolol  prn for perfoming anxiety Not using it enough by his own report.  Some for performance anxiety.    12 mos  Cameron Beat, MD, DFAPA  Please see After Visit Summary for patient specific instructions.  No future appointments.    No orders of the defined types were placed in this encounter.   -------------------------------

## 2023-09-21 ENCOUNTER — Other Ambulatory Visit: Payer: Self-pay | Admitting: Surgery

## 2023-09-21 DIAGNOSIS — I7121 Aneurysm of the ascending aorta, without rupture: Secondary | ICD-10-CM

## 2023-10-14 ENCOUNTER — Emergency Department (HOSPITAL_BASED_OUTPATIENT_CLINIC_OR_DEPARTMENT_OTHER)
Admission: EM | Admit: 2023-10-14 | Discharge: 2023-10-14 | Disposition: A | Discharge status: Home / Self Care | Attending: Emergency Medicine | Admitting: Emergency Medicine

## 2023-10-14 ENCOUNTER — Other Ambulatory Visit: Payer: Self-pay

## 2023-10-14 ENCOUNTER — Encounter (HOSPITAL_BASED_OUTPATIENT_CLINIC_OR_DEPARTMENT_OTHER): Payer: Self-pay

## 2023-10-14 ENCOUNTER — Emergency Department (HOSPITAL_BASED_OUTPATIENT_CLINIC_OR_DEPARTMENT_OTHER)

## 2023-10-14 DIAGNOSIS — R1084 Generalized abdominal pain: Secondary | ICD-10-CM | POA: Diagnosis not present

## 2023-10-14 DIAGNOSIS — J45909 Unspecified asthma, uncomplicated: Secondary | ICD-10-CM | POA: Insufficient documentation

## 2023-10-14 DIAGNOSIS — R1030 Lower abdominal pain, unspecified: Secondary | ICD-10-CM | POA: Diagnosis present

## 2023-10-14 DIAGNOSIS — R109 Unspecified abdominal pain: Secondary | ICD-10-CM | POA: Diagnosis not present

## 2023-10-14 DIAGNOSIS — Z7982 Long term (current) use of aspirin: Secondary | ICD-10-CM | POA: Insufficient documentation

## 2023-10-14 DIAGNOSIS — E871 Hypo-osmolality and hyponatremia: Secondary | ICD-10-CM | POA: Diagnosis not present

## 2023-10-14 DIAGNOSIS — K5792 Diverticulitis of intestine, part unspecified, without perforation or abscess without bleeding: Secondary | ICD-10-CM | POA: Diagnosis not present

## 2023-10-14 DIAGNOSIS — K5732 Diverticulitis of large intestine without perforation or abscess without bleeding: Secondary | ICD-10-CM | POA: Diagnosis not present

## 2023-10-14 DIAGNOSIS — N3289 Other specified disorders of bladder: Secondary | ICD-10-CM | POA: Diagnosis not present

## 2023-10-14 DIAGNOSIS — N281 Cyst of kidney, acquired: Secondary | ICD-10-CM | POA: Diagnosis not present

## 2023-10-14 LAB — COMPREHENSIVE METABOLIC PANEL WITH GFR
ALT: 11 U/L (ref 0–44)
AST: 20 U/L (ref 15–41)
Albumin: 4.3 g/dL (ref 3.5–5.0)
Alkaline Phosphatase: 80 U/L (ref 38–126)
Anion gap: 11 (ref 5–15)
BUN: 14 mg/dL (ref 8–23)
CO2: 24 mmol/L (ref 22–32)
Calcium: 9.6 mg/dL (ref 8.9–10.3)
Chloride: 99 mmol/L (ref 98–111)
Creatinine, Ser: 0.95 mg/dL (ref 0.61–1.24)
GFR, Estimated: >60 mL/min (ref >60)
Glucose, Bld: 99 mg/dL (ref 70–99)
Potassium: 4 mmol/L (ref 3.5–5.1)
Sodium: 134 mmol/L — ABNORMAL LOW (ref 135–145)
Total Bilirubin: 0.6 mg/dL (ref 0.0–1.2)
Total Protein: 7 g/dL (ref 6.5–8.1)

## 2023-10-14 LAB — URINALYSIS, ROUTINE W REFLEX MICROSCOPIC
Bilirubin Urine: NEGATIVE
Glucose, UA: NEGATIVE mg/dL
Hgb urine dipstick: NEGATIVE
Ketones, ur: NEGATIVE mg/dL
Leukocytes,Ua: NEGATIVE
Nitrite: NEGATIVE
Protein, ur: NEGATIVE mg/dL
Specific Gravity, Urine: 1.015 (ref 1.005–1.030)
pH: 7 (ref 5.0–8.0)

## 2023-10-14 LAB — CBC WITH DIFFERENTIAL/PLATELET
Abs Immature Granulocytes: 0.03 K/uL (ref 0.00–0.07)
Basophils Absolute: 0.1 K/uL (ref 0.0–0.1)
Basophils Relative: 1 %
Eosinophils Absolute: 0.1 K/uL (ref 0.0–0.5)
Eosinophils Relative: 1 %
HCT: 39.3 % (ref 39.0–52.0)
Hemoglobin: 13.9 g/dL (ref 13.0–17.0)
Immature Granulocytes: 0 %
Lymphocytes Relative: 13 %
Lymphs Abs: 1.2 K/uL (ref 0.7–4.0)
MCH: 32.4 pg (ref 26.0–34.0)
MCHC: 35.4 g/dL (ref 30.0–36.0)
MCV: 91.6 fL (ref 80.0–100.0)
Monocytes Absolute: 0.9 K/uL (ref 0.1–1.0)
Monocytes Relative: 10 %
Neutro Abs: 6.9 K/uL (ref 1.7–7.7)
Neutrophils Relative %: 75 %
Platelets: 263 K/uL (ref 150–400)
RBC: 4.29 MIL/uL (ref 4.22–5.81)
RDW: 12.7 % (ref 11.5–15.5)
WBC: 9.2 K/uL (ref 4.0–10.5)
nRBC: 0 % (ref 0.0–0.2)

## 2023-10-14 LAB — LIPASE, BLOOD: Lipase: 31 U/L (ref 11–51)

## 2023-10-14 MED ORDER — AMOXICILLIN-POT CLAVULANATE 875-125 MG PO TABS: 1.0000 | ORAL_TABLET | Freq: Two times a day (BID) | ORAL | 0 refills | Status: AC

## 2023-10-14 MED ORDER — AMOXICILLIN-POT CLAVULANATE 875-125 MG PO TABS
1.0000 | ORAL_TABLET | Freq: Once | ORAL | Status: AC
  Administered 2023-10-14: 1 via ORAL
  Filled 2023-10-14: qty 1

## 2023-10-14 MED ORDER — IOHEXOL 300 MG/ML  SOLN
100.0000 mL | Freq: Once | INTRAMUSCULAR | Status: AC | PRN
  Administered 2023-10-14: 100 mL via INTRAVENOUS

## 2023-10-14 MED ORDER — HYDROCODONE-ACETAMINOPHEN 5-325 MG PO TABS: 1.0000 | ORAL_TABLET | Freq: Four times a day (QID) | ORAL | 0 refills | Status: AC | PRN

## 2023-10-14 MED ORDER — ONDANSETRON HCL 4 MG PO TABS: 4.0000 mg | ORAL_TABLET | Freq: Four times a day (QID) | ORAL | 0 refills | Status: AC

## 2023-10-14 MED ORDER — SODIUM CHLORIDE 0.9 % IV BOLUS
1000.0000 mL | Freq: Once | INTRAVENOUS | Status: AC
  Administered 2023-10-14: 1000 mL via INTRAVENOUS

## 2023-10-14 NOTE — ED Provider Notes (Signed)
 [place] [facility] Provider Note   CSN: [id number] Arrival date & time: 10/14/23  9064     Patient presents with: Abdominal Pain   Cameron Stout is a 73 y.o. male with medical history of chronic pain syndrome, chronic use of systemic steroids, osteopenia, polymyalgia rheumatica, asthma, chronic pain of right knee, hernia repair.  Patient presents to ED for evaluation of lower abdominal pain.  Patient reports that for the last 5 days he has had intermittent abdominal pain worse when he tries to ambulate, better when he rests.  He reports that this abdominal pain has acutely worsened in the last 12 hours causing him to seek medical attention.  Reports that he was woken from his sleep this morning around 2 AM with a crampy abdominal pain located in his lower abdominal fields as well as his suprapubic region.  He reports that when he attempts have a bowel movement is greatly worsens his abdominal pain.  He reports he has not had a firm bowel movement in over 5 days, states he has clear watery diarrhea a few times during a bowel movement which acutely worsens his pain.  Reports he also has pain in the suprapubic region but denies any dysuria or flank pain.  Denies nausea, vomiting or fevers at home.  Denies blood in stool.  Reports history of hernia repair but denies any cholecystectomy, appendectomy.  Denies history of bowel obstruction.  Reports that his pain is also worsened when he attempts to ingest food.  Was seen in urgent care and redirected to ED for imaging.    Abdominal Pain      Prior to Admission medications   Medication Sig Start Date End Date Taking? Authorizing Provider  HYDROcodone -acetaminophen  (NORCO/VICODIN) 5-325 MG tablet Take 1 tablet by mouth every 6 (six) hours as needed for severe pain (pain score 7-10). 10/14/23  Yes Ruthell Lonni FALCON, PA-C  ondansetron  (ZOFRAN ) 4 MG tablet Take 1 tablet (4 mg total) by mouth every 6 (six)  hours. 10/14/23  Yes Ruthell Lonni FALCON, PA-C  aspirin EC 81 MG tablet Take 81 mg by mouth daily.    [provider]  dexlansoprazole (DEXILANT) 60 MG capsule Take 60 mg by mouth daily.    [provider]  Eszopiclone  3 MG TABS Take 1 tablet (3 mg total) by mouth at bedtime. Take immediately before bedtime 08/10/23   Cottle, Lorene KANDICE Raddle., MD  Evolocumab  (REPATHA  SURECLICK) 140 MG/ML SOAJ Inject 140 mg into the skin every 14 (fourteen) days. 04/05/23   Revankar, Jennifer SAUNDERS, MD  ezetimibe (ZETIA) 10 MG tablet Take 10 mg by mouth every morning.    [provider]  loratadine (CLARITIN) 10 MG tablet Take 10 mg by mouth as needed for allergies or rhinitis.    [provider]  OXcarbazepine  (TRILEPTAL ) 150 MG tablet 1 twice daily and 2 tablets at night 08/10/23   Cottle, Lorene KANDICE Raddle., MD  propranolol  (INDERAL ) 20 MG tablet TAKE 1 TO 2 TABLETS BY MOUTH TWICE A DAY AS NEEDED FOR ANXIETY 01/17/22   Cottle, Lorene KANDICE Raddle., MD    Allergies: Atorvastatin and Quinolones    Review of Systems  Gastrointestinal:  Positive for abdominal pain.  All other systems reviewed and are negative.   Updated Vital Signs BP 122/79   Pulse 72   Temp 98 F (36.7 C) (Oral)   Resp 18   SpO2 97%   Physical Exam Vitals and nursing note reviewed.  Constitutional:  General: He is not in acute distress.    Appearance: He is well-developed.  HENT:     Head: Normocephalic and atraumatic.  Eyes:     Conjunctiva/sclera: Conjunctivae normal.  Cardiovascular:     Rate and Rhythm: Normal rate and regular rhythm.     Heart sounds: No murmur heard. Pulmonary:     Effort: Pulmonary effort is normal. No respiratory distress.     Breath sounds: Normal breath sounds.  Abdominal:     Palpations: Abdomen is soft.     Tenderness: There is abdominal tenderness.     Comments: LLQ TTP  Musculoskeletal:        General: No swelling.     Cervical back: Neck supple.  Skin:    General: Skin is warm  and dry.     Capillary Refill: Capillary refill takes less than 2 seconds.  Neurological:     Mental Status: He is alert and oriented to person, place, and time. Mental status is at baseline.  Psychiatric:        Mood and Affect: Mood normal.     (all labs ordered are listed, but only abnormal results are displayed) Labs Reviewed  COMPREHENSIVE METABOLIC PANEL WITH GFR - Abnormal; Notable for the following components:      Result Value   Sodium 134 (*)    All other components within normal limits  CBC WITH DIFFERENTIAL/PLATELET  LIPASE, BLOOD  URINALYSIS, ROUTINE W REFLEX MICROSCOPIC    EKG: None  Radiology: CT ABDOMEN PELVIS W CONTRAST Result Date: 10/14/2023 EXAM: CT ABDOMEN AND PELVIS WITH CONTRAST 10/14/2023 11:54:26 AM TECHNIQUE: CT of the abdomen and pelvis was performed with the administration of intravenous contrast. Multiplanar reformatted images are provided for review. Automated exposure control, iterative reconstruction, and/or weight based adjustment of the mA/kV was utilized to reduce the radiation dose to as low as reasonably achievable. COMPARISON: CT renal stone protocol 05/25/2022. MR abdomen without and with contrast 11/19/2022. CLINICAL HISTORY: Abdominal pain, acute, nonlocalized; Bowel obstruction suspected. FINDINGS: LOWER CHEST: Mild dependent atelectasis is present bilaterally. LIVER: The liver is unremarkable. GALLBLADDER AND BILE DUCTS: Gallbladder is unremarkable. No biliary ductal dilatation. SPLEEN: No acute abnormality. PANCREAS: No acute abnormality. ADRENAL GLANDS: No acute abnormality. KIDNEYS, URETERS AND BLADDER: A simple cyst posteriorly in the left kidney measures 18 mm. A cystic lesion at the lower pole of the right kidney demonstrates stable focal septations. No stones in the kidneys or ureters. No hydronephrosis. No perinephric or periureteral stranding. Urinary bladder is unremarkable. GI AND BOWEL: Findings in the sigmoid colon are compatible with  acute diverticulitis. No bowel obstruction. PERITONEUM AND RETROPERITONEUM: No significant free fluid or discrete fluid collection is present. VASCULATURE: Aorta is normal in caliber. LYMPH NODES: No lymphadenopathy. REPRODUCTIVE ORGANS: No acute abnormality. BONES AND SOFT TISSUES: Multilevel degenerative changes are again noted in the lower lumbar spine. No acute osseous abnormality. No focal soft tissue abnormality. IMPRESSION: 1. Acute diverticulitis involving the sigmoid colon. No significant free fluid or discrete fluid collection. 2. Stable Bosniak 77F cystic lesion at the lower pole of the right kidney. Recommend follow-up MRI of the abdomen in July 2026. Electronically signed by: Lonni Necessary MD 10/14/2023 12:25 PM EDT RP Workstation: [id number]    Procedures   Medications Ordered in the ED  sodium chloride  0.9 % bolus 1,000 mL (0 mLs Intravenous Stopped 10/14/23 1102)  iohexol  (OMNIPAQUE ) 300 MG/ML solution 100 mL (100 mLs Intravenous Contrast Given 10/14/23 1134)  amoxicillin -clavulanate (AUGMENTIN ) 875-125 MG per tablet 1 tablet (  1 tablet Oral Given 10/14/23 1251)     Medical Decision Making Amount and/or Complexity of Data Reviewed Labs: ordered. Radiology: ordered.   Very pleasant 73 year old male presents for evaluation.  Please see HPI for the details.  73 year old male presenting with 5 days of abdominal pain.  States abdominal pain is worse after ingesting food, better with rest.  Denies history of abdominal issues.  Does not follow with GI.  Initially, his vital signs are reassuring.  No tachycardia, he is afebrile.  Lungs are clear bilaterally.  He does have some tenderness in his suprapubic region and left lower quadrant.  Will assess with abdominal pain labs as well as CT scan of his abdomen.  He was seen in urgent care and redirected to ED for imaging.  Patient labs are grossly unremarkable.  His CBC shows no leukocytosis or anemia.  His metabolic panel showed a  slightly decreased sodium 134 however no other electrolyte derangement, no elevated LFTs, anion gap 11.  Urinalysis negative for all.  Lipase 31.  CT abdomen pelvis shows acute diverticulitis involving the sigmoid colon.  There is no significant free fluid or discrete fluid collection.  The patient has had no nausea or vomiting here.  He has no leukocytosis.  He has denied that he requires any kind of pain control.  I feel the patient is stable to follow-up outpatient.  Will place patient on 7 days of Augmentin  twice daily.  Will have the patient follow-up with GI doctor.  Have given return precautions and he is voiced understanding.  He is stable to discharge home.      Final diagnoses:  Diverticulitis    ED Discharge Orders          Ordered    ondansetron  (ZOFRAN ) 4 MG tablet  Every 6 hours        10/14/23 1312    HYDROcodone -acetaminophen  (NORCO/VICODIN) 5-325 MG tablet  Every 6 hours PRN        10/14/23 1312               Ruthell Lonni FALCON, PA-C 10/14/23 1313    Ruthe Cornet, DO 10/14/23 1337

## 2023-10-14 NOTE — ED Triage Notes (Signed)
 Pt reports lower quadrant abdominal pain X 5. Endorses diarrhea. Denies N&V. Seeing at [facility] pta and sent here for evaluation.

## 2023-10-14 NOTE — Discharge Instructions (Addendum)
 It was a pleasure taking part in your care.  As we discussed, you have diverticulitis.  This is an inflammation of one of the outpouchings of the colon.  Please begin taking Augmentin  twice a day for 7 days.  Please follow-up with GI doctor for possible colonoscopy.  Please read attached guide concerning diverticulitis.  He may take hydrocodone  pain medicine for pain.  Take this once every 6 hours.  May also take Zofran  for nausea.  Return to the ED with any new or worsening symptoms.

## 2023-10-14 NOTE — ED Notes (Signed)
 D/c paperwork reviewed with pt, including prescriptions and follow up care.  All questions and/or concerns addressed at time of d/c.  No further needs expressed. . Pt verbalized understanding, Ambulatory without assistance to ED exit, NAD.

## 2023-10-17 DIAGNOSIS — R109 Unspecified abdominal pain: Secondary | ICD-10-CM | POA: Diagnosis not present

## 2023-10-17 DIAGNOSIS — R194 Change in bowel habit: Secondary | ICD-10-CM | POA: Diagnosis not present

## 2023-10-17 DIAGNOSIS — K219 Gastro-esophageal reflux disease without esophagitis: Secondary | ICD-10-CM | POA: Diagnosis not present

## 2023-10-17 DIAGNOSIS — K5792 Diverticulitis of intestine, part unspecified, without perforation or abscess without bleeding: Secondary | ICD-10-CM | POA: Diagnosis not present

## 2023-10-25 ENCOUNTER — Ambulatory Visit (HOSPITAL_COMMUNITY)

## 2023-10-30 ENCOUNTER — Ambulatory Visit (HOSPITAL_COMMUNITY)
Admission: RE | Admit: 2023-10-30 | Discharge: 2023-10-30 | Disposition: A | Discharge status: Home / Self Care | Source: Ambulatory Visit | Attending: Surgery | Admitting: Surgery

## 2023-10-30 DIAGNOSIS — I517 Cardiomegaly: Secondary | ICD-10-CM | POA: Insufficient documentation

## 2023-10-30 DIAGNOSIS — I700 Atherosclerosis of aorta: Secondary | ICD-10-CM | POA: Insufficient documentation

## 2023-10-30 DIAGNOSIS — I2510 Atherosclerotic heart disease of native coronary artery without angina pectoris: Secondary | ICD-10-CM | POA: Insufficient documentation

## 2023-10-30 DIAGNOSIS — I7121 Aneurysm of the ascending aorta, without rupture: Secondary | ICD-10-CM | POA: Diagnosis not present

## 2023-10-30 MED ORDER — IOHEXOL 350 MG/ML SOLN
75.0000 mL | Freq: Once | INTRAVENOUS | Status: AC | PRN
  Administered 2023-10-30 (×2): 75 mL via INTRAVENOUS

## 2023-11-01 ENCOUNTER — Ambulatory Visit

## 2023-11-07 NOTE — Patient Instructions (Signed)

## 2023-11-07 NOTE — Progress Notes (Unsigned)
 [address] [place] [address]             [phone]            Cameron Stout [id number] 08/27/1950   History of Present Illness:  [name] is a 73 year old male with medical history of hypertension, asthma, allergic rhinitis, GERD, degenerative disc disease, osteopenia, osteoarthritis, bipolar and hyperlipidemia who presents for continued surveillance of ascending thoracic aortic aneurysm.  Aneurysm was initially discovered on cardiac CT scan in 11/2017 and measured 4. 5 cm at that time.  CTA of chest on 10/30/2023 measured aneurysm at 4.7 cm.  Echocardiogram from 2024 showed aortic valve is normal in structure. Blood pressure*** Exercise*** Symptoms      Current Outpatient Medications on File Prior to Visit  Medication Sig Dispense Refill   amoxicillin -clavulanate (AUGMENTIN ) 875-125 MG tablet Take 1 tablet by mouth every 12 (twelve) hours. 14 tablet 0   aspirin EC 81 MG tablet Take 81 mg by mouth daily.     dexlansoprazole (DEXILANT) 60 MG capsule Take 60 mg by mouth daily.     Eszopiclone  3 MG TABS Take 1 tablet (3 mg total) by mouth at bedtime. Take immediately before bedtime 30 tablet 3   Evolocumab  (REPATHA  SURECLICK) 140 MG/ML SOAJ Inject 140 mg into the skin every 14 (fourteen) days. 6 mL 2   ezetimibe (ZETIA) 10 MG tablet Take 10 mg by mouth every morning.     HYDROcodone -acetaminophen  (NORCO/VICODIN) 5-325 MG tablet Take 1 tablet by mouth every 6 (six) hours as needed for severe pain (pain score 7-10). 10 tablet 0   loratadine (CLARITIN) 10 MG tablet Take 10 mg by mouth as needed for allergies or rhinitis.     ondansetron  (ZOFRAN ) 4 MG tablet Take 1 tablet (4 mg total) by mouth every 6 (six) hours. 12 tablet 0   OXcarbazepine  (TRILEPTAL ) 150 MG tablet 1 twice daily and 2 tablets at night 360 tablet 4   propranolol  (INDERAL ) 20 MG tablet TAKE 1 TO 2 TABLETS BY MOUTH TWICE A DAY AS NEEDED FOR ANXIETY 360 tablet 1   No  current facility-administered medications on file prior to visit.     ROS:   There were no vitals taken for this visit.    Imaging: CLINICAL DATA:  Thoracic aortic aneurysm   EXAM: CT ANGIOGRAPHY CHEST WITH CONTRAST   TECHNIQUE: Multidetector CT imaging of the chest was performed using the standard protocol during bolus administration of intravenous contrast. Multiplanar CT image reconstructions and MIPs were obtained to evaluate the vascular anatomy.   RADIATION DOSE REDUCTION: This exam was performed according to the departmental dose-optimization program which includes automated exposure control, adjustment of the mA and/or kV according to patient size and/or use of iterative reconstruction technique.   CONTRAST:  75mL OMNIPAQUE  IOHEXOL  350 MG/ML SOLN   COMPARISON:  CTA chest dated 04/03/2023   FINDINGS: Cardiovascular:   Aortic Root:   --Valve: 3.4 x 2.9 cm   --Sinuses: 4.2 x 3.9 x 3.7 cm   --Sinotubular Junction: 4.2 x 4.0 cm   Limitations by motion: Mild   Thoracic Aorta:   --Ascending Aorta: 4.7 x 4.6 cm, unchanged when remeasured   --Aortic Arch: 4.1 x 3.8 cm   --Descending Aorta: 3.1 x 3.0 cm   Other:   Mild dilation of the right ventricle. No pericardial effusion. Pulmonary artery measures 2.7 cm in diameter. Right main pulmonary artery measures 3.3  cm and left main pulmonary artery measures 3.6 cm. Coronary artery calcifications.   Mediastinum/Nodes: Imaged thyroid  gland without nodules meeting criteria for imaging follow-up by size. Normal esophagus. No pathologically enlarged axillary, supraclavicular, mediastinal, or hilar lymph nodes.   Lungs/Pleura: The central airways are patent. Scattered calcified nodules, likely granulomata. No focal consolidation. Left lower lobe linear atelectasis/scarring. No pneumothorax. No pleural effusion.   Upper abdomen: Normal.   Musculoskeletal: No acute or abnormal lytic or blastic  osseous lesions. Multilevel degenerative changes of the thoracic spine.   Review of the MIP images confirms the above findings.   IMPRESSION: 1. Unchanged ascending thoracic aortic aneurysm measuring 4.7 cm. 2. Mild dilation of the right ventricle, suggestive of a degree of right heart dysfunction, possibly secondary to pulmonary artery narrowing with poststenotic dilation of the right and left main pulmonary arteries. 3. Aortic Atherosclerosis (ICD10-I70.0). Coronary artery calcifications. Assessment for potential risk factor modification, dietary therapy or pharmacologic therapy may be warranted, if clinically indicated.     Electronically Signed   By: Limin  Xu M.D.   On: 10/30/2023 09:15     A/P: Aneurysm of ascending aorta without rupture (HCC) - 4.7 cm ascending thoracic aortic aneurysm on CTA of chest. We discussed the natural history and and risk factors for growth of ascending aortic aneurysms. Discussed recommendations to minimize the risk of further expansion or dissection including careful blood pressure control, avoidance of contact sports and heavy lifting, attention to lipid management.  We covered the importance of smoking ***cessation/staying never user.  The patient does not yet meet surgical criteria of >5.5cm. The patient is aware of signs and symptoms of aortic dissection and when to present to the emergency department   -Follow up in 6 months with CTA of chest for continued surveillance      Risk Modification:  Statin:  ***  Smoking cessation instruction/counseling given:  {[facility]B [facility] SMOKING CESSATION COUNSELING:20758}  Patient was counseled on importance of Blood Pressure Control  They are instructed to contact their Primary Care Physician if they start to have blood pressure readings over 130s/90s. Do not ever stop blood pressure medications on your own, unless instructed by healthcare professional.  Please avoid use of Fluoroquinolones as this can  potentially increase your risk of Aortic Rupture and/or Dissection  Patient educated on signs and symptoms of Aortic Dissection, handout also provided in AVS  Cameron CHRISTELLA Rough, PA-C 11/07/23

## 2023-11-08 ENCOUNTER — Ambulatory Visit

## 2023-11-08 VITALS — BP 135/89 | HR 58 | Resp 18 | Ht 70.0 in | Wt 195.0 lb

## 2023-11-08 DIAGNOSIS — I7121 Aneurysm of the ascending aorta, without rupture: Secondary | ICD-10-CM | POA: Diagnosis not present

## 2023-11-24 ENCOUNTER — Other Ambulatory Visit: Payer: Self-pay | Admitting: Family Medicine

## 2023-11-24 DIAGNOSIS — N281 Cyst of kidney, acquired: Secondary | ICD-10-CM

## 2023-12-14 DIAGNOSIS — R051 Acute cough: Secondary | ICD-10-CM | POA: Diagnosis not present

## 2023-12-14 DIAGNOSIS — R0981 Nasal congestion: Secondary | ICD-10-CM | POA: Diagnosis not present

## 2023-12-14 DIAGNOSIS — U071 COVID-19: Secondary | ICD-10-CM | POA: Diagnosis not present

## 2023-12-25 ENCOUNTER — Ambulatory Visit
Admission: RE | Admit: 2023-12-25 | Discharge: 2023-12-25 | Disposition: A | Discharge status: Home / Self Care | Source: Ambulatory Visit | Attending: Family Medicine | Admitting: Family Medicine

## 2023-12-25 DIAGNOSIS — N281 Cyst of kidney, acquired: Secondary | ICD-10-CM

## 2023-12-25 MED ORDER — GADOPICLENOL 0.5 MMOL/ML IV SOLN
10.0000 mL | Freq: Once | INTRAVENOUS | Status: AC | PRN
  Administered 2023-12-25: 10 mL via INTRAVENOUS

## 2024-01-02 DIAGNOSIS — D123 Benign neoplasm of transverse colon: Secondary | ICD-10-CM | POA: Diagnosis not present

## 2024-01-02 DIAGNOSIS — K5732 Diverticulitis of large intestine without perforation or abscess without bleeding: Secondary | ICD-10-CM | POA: Diagnosis not present

## 2024-01-02 DIAGNOSIS — K640 First degree hemorrhoids: Secondary | ICD-10-CM | POA: Diagnosis not present

## 2024-01-04 DIAGNOSIS — D123 Benign neoplasm of transverse colon: Secondary | ICD-10-CM | POA: Diagnosis not present

## 2024-01-16 DIAGNOSIS — L300 Nummular dermatitis: Secondary | ICD-10-CM | POA: Diagnosis not present

## 2024-01-16 DIAGNOSIS — D408 Neoplasm of uncertain behavior of other specified male genital organs: Secondary | ICD-10-CM | POA: Diagnosis not present

## 2024-01-16 DIAGNOSIS — L304 Erythema intertrigo: Secondary | ICD-10-CM | POA: Diagnosis not present

## 2024-01-19 ENCOUNTER — Other Ambulatory Visit: Payer: Self-pay | Admitting: Cardiology

## 2024-01-24 ENCOUNTER — Other Ambulatory Visit: Payer: Self-pay | Admitting: Medical Genetics

## 2024-01-24 DIAGNOSIS — Z006 Encounter for examination for normal comparison and control in clinical research program: Secondary | ICD-10-CM

## 2024-02-26 DIAGNOSIS — I2510 Atherosclerotic heart disease of native coronary artery without angina pectoris: Secondary | ICD-10-CM | POA: Diagnosis not present

## 2024-02-26 DIAGNOSIS — E782 Mixed hyperlipidemia: Secondary | ICD-10-CM | POA: Diagnosis not present

## 2024-02-26 DIAGNOSIS — Z0000 Encounter for general adult medical examination without abnormal findings: Secondary | ICD-10-CM | POA: Diagnosis not present

## 2024-02-26 DIAGNOSIS — K219 Gastro-esophageal reflux disease without esophagitis: Secondary | ICD-10-CM | POA: Diagnosis not present

## 2024-02-26 DIAGNOSIS — M353 Polymyalgia rheumatica: Secondary | ICD-10-CM | POA: Diagnosis not present

## 2024-02-26 DIAGNOSIS — Z125 Encounter for screening for malignant neoplasm of prostate: Secondary | ICD-10-CM | POA: Diagnosis not present

## 2024-02-26 DIAGNOSIS — I712 Thoracic aortic aneurysm, without rupture, unspecified: Secondary | ICD-10-CM | POA: Diagnosis not present

## 2024-02-26 DIAGNOSIS — M8580 Other specified disorders of bone density and structure, unspecified site: Secondary | ICD-10-CM | POA: Diagnosis not present

## 2024-02-26 DIAGNOSIS — Z131 Encounter for screening for diabetes mellitus: Secondary | ICD-10-CM | POA: Diagnosis not present

## 2024-02-26 DIAGNOSIS — F319 Bipolar disorder, unspecified: Secondary | ICD-10-CM | POA: Diagnosis not present

## 2024-02-26 DIAGNOSIS — Z1331 Encounter for screening for depression: Secondary | ICD-10-CM | POA: Diagnosis not present

## 2024-02-26 DIAGNOSIS — N281 Cyst of kidney, acquired: Secondary | ICD-10-CM | POA: Diagnosis not present

## 2024-03-05 LAB — GENECONNECT MOLECULAR SCREEN: Genetic Analysis Overall Interpretation: NEGATIVE

## 2024-03-16 ENCOUNTER — Other Ambulatory Visit: Payer: Self-pay | Admitting: Psychiatry

## 2024-03-16 DIAGNOSIS — F5105 Insomnia due to other mental disorder: Secondary | ICD-10-CM

## 2024-03-28 ENCOUNTER — Other Ambulatory Visit: Payer: Self-pay

## 2024-03-28 DIAGNOSIS — I7121 Aneurysm of the ascending aorta, without rupture: Secondary | ICD-10-CM

## 2024-05-01 ENCOUNTER — Ambulatory Visit (HOSPITAL_COMMUNITY)

## 2024-05-13 ENCOUNTER — Ambulatory Visit: Admitting: Psychiatry

## 2024-05-15 ENCOUNTER — Ambulatory Visit
